# Patient Record
Sex: Male | Born: 1945
Health system: Southern US, Community
[De-identification: ages and names within clinical notes are randomized; demographics above are authoritative.]

## PROBLEM LIST (undated history)

## (undated) DIAGNOSIS — G709 Myoneural disorder, unspecified: Secondary | ICD-10-CM

## (undated) DIAGNOSIS — J189 Pneumonia, unspecified organism: Secondary | ICD-10-CM

## (undated) DIAGNOSIS — M1990 Unspecified osteoarthritis, unspecified site: Secondary | ICD-10-CM

## (undated) DIAGNOSIS — I10 Essential (primary) hypertension: Secondary | ICD-10-CM

## (undated) DIAGNOSIS — C61 Malignant neoplasm of prostate: Secondary | ICD-10-CM

## (undated) DIAGNOSIS — R011 Cardiac murmur, unspecified: Secondary | ICD-10-CM

## (undated) HISTORY — PX: EYE SURGERY: SHX253

## (undated) HISTORY — PX: PROSTATE BIOPSY: SHX241

---

## 2005-04-21 ENCOUNTER — Ambulatory Visit (HOSPITAL_COMMUNITY): Admission: RE | Admit: 2005-04-21 | Discharge: 2005-04-21 | Payer: Self-pay | Admitting: Gastroenterology

## 2015-12-17 DIAGNOSIS — J3089 Other allergic rhinitis: Secondary | ICD-10-CM | POA: Diagnosis not present

## 2015-12-17 DIAGNOSIS — J301 Allergic rhinitis due to pollen: Secondary | ICD-10-CM | POA: Diagnosis not present

## 2016-01-13 DIAGNOSIS — J301 Allergic rhinitis due to pollen: Secondary | ICD-10-CM | POA: Diagnosis not present

## 2016-01-13 DIAGNOSIS — J3089 Other allergic rhinitis: Secondary | ICD-10-CM | POA: Diagnosis not present

## 2016-02-11 DIAGNOSIS — J3089 Other allergic rhinitis: Secondary | ICD-10-CM | POA: Diagnosis not present

## 2016-02-11 DIAGNOSIS — J301 Allergic rhinitis due to pollen: Secondary | ICD-10-CM | POA: Diagnosis not present

## 2016-03-08 DIAGNOSIS — J3089 Other allergic rhinitis: Secondary | ICD-10-CM | POA: Diagnosis not present

## 2016-03-08 DIAGNOSIS — J301 Allergic rhinitis due to pollen: Secondary | ICD-10-CM | POA: Diagnosis not present

## 2016-04-12 DIAGNOSIS — J301 Allergic rhinitis due to pollen: Secondary | ICD-10-CM | POA: Diagnosis not present

## 2016-04-12 DIAGNOSIS — J3089 Other allergic rhinitis: Secondary | ICD-10-CM | POA: Diagnosis not present

## 2016-05-16 DIAGNOSIS — J3089 Other allergic rhinitis: Secondary | ICD-10-CM | POA: Diagnosis not present

## 2016-05-16 DIAGNOSIS — J301 Allergic rhinitis due to pollen: Secondary | ICD-10-CM | POA: Diagnosis not present

## 2016-06-15 DIAGNOSIS — J301 Allergic rhinitis due to pollen: Secondary | ICD-10-CM | POA: Diagnosis not present

## 2016-06-15 DIAGNOSIS — J3089 Other allergic rhinitis: Secondary | ICD-10-CM | POA: Diagnosis not present

## 2016-07-13 DIAGNOSIS — J301 Allergic rhinitis due to pollen: Secondary | ICD-10-CM | POA: Diagnosis not present

## 2016-07-13 DIAGNOSIS — J3089 Other allergic rhinitis: Secondary | ICD-10-CM | POA: Diagnosis not present

## 2016-07-18 DIAGNOSIS — J3089 Other allergic rhinitis: Secondary | ICD-10-CM | POA: Diagnosis not present

## 2016-07-18 DIAGNOSIS — J301 Allergic rhinitis due to pollen: Secondary | ICD-10-CM | POA: Diagnosis not present

## 2016-08-12 DIAGNOSIS — J301 Allergic rhinitis due to pollen: Secondary | ICD-10-CM | POA: Diagnosis not present

## 2016-08-12 DIAGNOSIS — J3089 Other allergic rhinitis: Secondary | ICD-10-CM | POA: Diagnosis not present

## 2016-09-12 DIAGNOSIS — J3089 Other allergic rhinitis: Secondary | ICD-10-CM | POA: Diagnosis not present

## 2016-09-12 DIAGNOSIS — J301 Allergic rhinitis due to pollen: Secondary | ICD-10-CM | POA: Diagnosis not present

## 2016-09-21 DIAGNOSIS — J3089 Other allergic rhinitis: Secondary | ICD-10-CM | POA: Diagnosis not present

## 2016-09-21 DIAGNOSIS — J301 Allergic rhinitis due to pollen: Secondary | ICD-10-CM | POA: Diagnosis not present

## 2016-10-04 DIAGNOSIS — J301 Allergic rhinitis due to pollen: Secondary | ICD-10-CM | POA: Diagnosis not present

## 2016-10-04 DIAGNOSIS — J3089 Other allergic rhinitis: Secondary | ICD-10-CM | POA: Diagnosis not present

## 2016-10-06 DIAGNOSIS — J301 Allergic rhinitis due to pollen: Secondary | ICD-10-CM | POA: Diagnosis not present

## 2016-10-06 DIAGNOSIS — J3089 Other allergic rhinitis: Secondary | ICD-10-CM | POA: Diagnosis not present

## 2016-10-11 DIAGNOSIS — J301 Allergic rhinitis due to pollen: Secondary | ICD-10-CM | POA: Diagnosis not present

## 2016-10-11 DIAGNOSIS — J3089 Other allergic rhinitis: Secondary | ICD-10-CM | POA: Diagnosis not present

## 2016-10-13 DIAGNOSIS — J301 Allergic rhinitis due to pollen: Secondary | ICD-10-CM | POA: Diagnosis not present

## 2016-10-13 DIAGNOSIS — J3089 Other allergic rhinitis: Secondary | ICD-10-CM | POA: Diagnosis not present

## 2016-10-17 DIAGNOSIS — J301 Allergic rhinitis due to pollen: Secondary | ICD-10-CM | POA: Diagnosis not present

## 2016-10-17 DIAGNOSIS — J3089 Other allergic rhinitis: Secondary | ICD-10-CM | POA: Diagnosis not present

## 2016-10-19 DIAGNOSIS — J3089 Other allergic rhinitis: Secondary | ICD-10-CM | POA: Diagnosis not present

## 2016-10-19 DIAGNOSIS — J301 Allergic rhinitis due to pollen: Secondary | ICD-10-CM | POA: Diagnosis not present

## 2016-10-21 DIAGNOSIS — J301 Allergic rhinitis due to pollen: Secondary | ICD-10-CM | POA: Diagnosis not present

## 2016-10-21 DIAGNOSIS — J3089 Other allergic rhinitis: Secondary | ICD-10-CM | POA: Diagnosis not present

## 2016-10-24 DIAGNOSIS — J301 Allergic rhinitis due to pollen: Secondary | ICD-10-CM | POA: Diagnosis not present

## 2016-10-24 DIAGNOSIS — J3089 Other allergic rhinitis: Secondary | ICD-10-CM | POA: Diagnosis not present

## 2016-10-26 DIAGNOSIS — J301 Allergic rhinitis due to pollen: Secondary | ICD-10-CM | POA: Diagnosis not present

## 2016-10-26 DIAGNOSIS — J3089 Other allergic rhinitis: Secondary | ICD-10-CM | POA: Diagnosis not present

## 2016-10-31 DIAGNOSIS — J3089 Other allergic rhinitis: Secondary | ICD-10-CM | POA: Diagnosis not present

## 2016-10-31 DIAGNOSIS — J301 Allergic rhinitis due to pollen: Secondary | ICD-10-CM | POA: Diagnosis not present

## 2016-11-02 DIAGNOSIS — J3089 Other allergic rhinitis: Secondary | ICD-10-CM | POA: Diagnosis not present

## 2016-11-02 DIAGNOSIS — J301 Allergic rhinitis due to pollen: Secondary | ICD-10-CM | POA: Diagnosis not present

## 2016-11-04 DIAGNOSIS — J301 Allergic rhinitis due to pollen: Secondary | ICD-10-CM | POA: Diagnosis not present

## 2016-11-04 DIAGNOSIS — J3089 Other allergic rhinitis: Secondary | ICD-10-CM | POA: Diagnosis not present

## 2016-11-07 DIAGNOSIS — J301 Allergic rhinitis due to pollen: Secondary | ICD-10-CM | POA: Diagnosis not present

## 2016-11-07 DIAGNOSIS — J3089 Other allergic rhinitis: Secondary | ICD-10-CM | POA: Diagnosis not present

## 2016-11-09 DIAGNOSIS — J301 Allergic rhinitis due to pollen: Secondary | ICD-10-CM | POA: Diagnosis not present

## 2016-11-09 DIAGNOSIS — J3089 Other allergic rhinitis: Secondary | ICD-10-CM | POA: Diagnosis not present

## 2016-11-11 DIAGNOSIS — J301 Allergic rhinitis due to pollen: Secondary | ICD-10-CM | POA: Diagnosis not present

## 2016-11-11 DIAGNOSIS — J3089 Other allergic rhinitis: Secondary | ICD-10-CM | POA: Diagnosis not present

## 2016-11-14 DIAGNOSIS — J3089 Other allergic rhinitis: Secondary | ICD-10-CM | POA: Diagnosis not present

## 2016-11-14 DIAGNOSIS — J301 Allergic rhinitis due to pollen: Secondary | ICD-10-CM | POA: Diagnosis not present

## 2016-11-16 DIAGNOSIS — J301 Allergic rhinitis due to pollen: Secondary | ICD-10-CM | POA: Diagnosis not present

## 2016-11-16 DIAGNOSIS — J3089 Other allergic rhinitis: Secondary | ICD-10-CM | POA: Diagnosis not present

## 2016-11-21 DIAGNOSIS — J3089 Other allergic rhinitis: Secondary | ICD-10-CM | POA: Diagnosis not present

## 2016-11-21 DIAGNOSIS — J301 Allergic rhinitis due to pollen: Secondary | ICD-10-CM | POA: Diagnosis not present

## 2016-11-23 DIAGNOSIS — J301 Allergic rhinitis due to pollen: Secondary | ICD-10-CM | POA: Diagnosis not present

## 2016-11-23 DIAGNOSIS — J3089 Other allergic rhinitis: Secondary | ICD-10-CM | POA: Diagnosis not present

## 2016-11-29 DIAGNOSIS — J3089 Other allergic rhinitis: Secondary | ICD-10-CM | POA: Diagnosis not present

## 2016-11-29 DIAGNOSIS — J301 Allergic rhinitis due to pollen: Secondary | ICD-10-CM | POA: Diagnosis not present

## 2016-12-01 DIAGNOSIS — J3089 Other allergic rhinitis: Secondary | ICD-10-CM | POA: Diagnosis not present

## 2016-12-01 DIAGNOSIS — J301 Allergic rhinitis due to pollen: Secondary | ICD-10-CM | POA: Diagnosis not present

## 2016-12-06 DIAGNOSIS — J3089 Other allergic rhinitis: Secondary | ICD-10-CM | POA: Diagnosis not present

## 2016-12-06 DIAGNOSIS — J301 Allergic rhinitis due to pollen: Secondary | ICD-10-CM | POA: Diagnosis not present

## 2017-01-09 DIAGNOSIS — J3089 Other allergic rhinitis: Secondary | ICD-10-CM | POA: Diagnosis not present

## 2017-01-09 DIAGNOSIS — J301 Allergic rhinitis due to pollen: Secondary | ICD-10-CM | POA: Diagnosis not present

## 2017-02-06 DIAGNOSIS — J3089 Other allergic rhinitis: Secondary | ICD-10-CM | POA: Diagnosis not present

## 2017-02-06 DIAGNOSIS — J301 Allergic rhinitis due to pollen: Secondary | ICD-10-CM | POA: Diagnosis not present

## 2017-03-15 DIAGNOSIS — J301 Allergic rhinitis due to pollen: Secondary | ICD-10-CM | POA: Diagnosis not present

## 2017-03-15 DIAGNOSIS — J3089 Other allergic rhinitis: Secondary | ICD-10-CM | POA: Diagnosis not present

## 2017-03-21 DIAGNOSIS — J3089 Other allergic rhinitis: Secondary | ICD-10-CM | POA: Diagnosis not present

## 2017-03-21 DIAGNOSIS — J301 Allergic rhinitis due to pollen: Secondary | ICD-10-CM | POA: Diagnosis not present

## 2017-04-17 DIAGNOSIS — J301 Allergic rhinitis due to pollen: Secondary | ICD-10-CM | POA: Diagnosis not present

## 2017-04-17 DIAGNOSIS — J3089 Other allergic rhinitis: Secondary | ICD-10-CM | POA: Diagnosis not present

## 2017-05-10 DIAGNOSIS — J3089 Other allergic rhinitis: Secondary | ICD-10-CM | POA: Diagnosis not present

## 2017-05-10 DIAGNOSIS — J301 Allergic rhinitis due to pollen: Secondary | ICD-10-CM | POA: Diagnosis not present

## 2017-06-08 DIAGNOSIS — J3089 Other allergic rhinitis: Secondary | ICD-10-CM | POA: Diagnosis not present

## 2017-06-08 DIAGNOSIS — J301 Allergic rhinitis due to pollen: Secondary | ICD-10-CM | POA: Diagnosis not present

## 2017-07-10 DIAGNOSIS — J301 Allergic rhinitis due to pollen: Secondary | ICD-10-CM | POA: Diagnosis not present

## 2017-07-10 DIAGNOSIS — J3089 Other allergic rhinitis: Secondary | ICD-10-CM | POA: Diagnosis not present

## 2017-07-12 DIAGNOSIS — J301 Allergic rhinitis due to pollen: Secondary | ICD-10-CM | POA: Diagnosis not present

## 2017-07-12 DIAGNOSIS — J3089 Other allergic rhinitis: Secondary | ICD-10-CM | POA: Diagnosis not present

## 2017-07-17 DIAGNOSIS — J301 Allergic rhinitis due to pollen: Secondary | ICD-10-CM | POA: Diagnosis not present

## 2017-07-17 DIAGNOSIS — J3089 Other allergic rhinitis: Secondary | ICD-10-CM | POA: Diagnosis not present

## 2017-07-19 DIAGNOSIS — J3089 Other allergic rhinitis: Secondary | ICD-10-CM | POA: Diagnosis not present

## 2017-07-19 DIAGNOSIS — J301 Allergic rhinitis due to pollen: Secondary | ICD-10-CM | POA: Diagnosis not present

## 2017-07-24 DIAGNOSIS — J3089 Other allergic rhinitis: Secondary | ICD-10-CM | POA: Diagnosis not present

## 2017-07-24 DIAGNOSIS — J301 Allergic rhinitis due to pollen: Secondary | ICD-10-CM | POA: Diagnosis not present

## 2017-08-15 DIAGNOSIS — J301 Allergic rhinitis due to pollen: Secondary | ICD-10-CM | POA: Diagnosis not present

## 2017-08-15 DIAGNOSIS — J3089 Other allergic rhinitis: Secondary | ICD-10-CM | POA: Diagnosis not present

## 2017-09-21 DIAGNOSIS — J301 Allergic rhinitis due to pollen: Secondary | ICD-10-CM | POA: Diagnosis not present

## 2017-09-21 DIAGNOSIS — Z23 Encounter for immunization: Secondary | ICD-10-CM | POA: Diagnosis not present

## 2017-09-21 DIAGNOSIS — J3089 Other allergic rhinitis: Secondary | ICD-10-CM | POA: Diagnosis not present

## 2017-10-13 DIAGNOSIS — J301 Allergic rhinitis due to pollen: Secondary | ICD-10-CM | POA: Diagnosis not present

## 2017-10-13 DIAGNOSIS — J3089 Other allergic rhinitis: Secondary | ICD-10-CM | POA: Diagnosis not present

## 2017-11-08 DIAGNOSIS — J3089 Other allergic rhinitis: Secondary | ICD-10-CM | POA: Diagnosis not present

## 2017-11-08 DIAGNOSIS — J301 Allergic rhinitis due to pollen: Secondary | ICD-10-CM | POA: Diagnosis not present

## 2017-12-11 DIAGNOSIS — J301 Allergic rhinitis due to pollen: Secondary | ICD-10-CM | POA: Diagnosis not present

## 2017-12-11 DIAGNOSIS — J3089 Other allergic rhinitis: Secondary | ICD-10-CM | POA: Diagnosis not present

## 2018-01-03 ENCOUNTER — Other Ambulatory Visit: Payer: Self-pay | Admitting: Family Medicine

## 2018-01-03 ENCOUNTER — Ambulatory Visit
Admission: RE | Admit: 2018-01-03 | Discharge: 2018-01-03 | Disposition: A | Discharge status: Home / Self Care | Payer: Medicare Other | Source: Ambulatory Visit | Attending: Family Medicine | Admitting: Family Medicine

## 2018-01-03 DIAGNOSIS — Z6823 Body mass index (BMI) 23.0-23.9, adult: Secondary | ICD-10-CM | POA: Diagnosis not present

## 2018-01-03 DIAGNOSIS — M130 Polyarthritis, unspecified: Secondary | ICD-10-CM | POA: Diagnosis not present

## 2018-01-03 DIAGNOSIS — Z125 Encounter for screening for malignant neoplasm of prostate: Secondary | ICD-10-CM | POA: Diagnosis not present

## 2018-01-03 DIAGNOSIS — I10 Essential (primary) hypertension: Secondary | ICD-10-CM | POA: Diagnosis not present

## 2018-01-03 DIAGNOSIS — M19041 Primary osteoarthritis, right hand: Secondary | ICD-10-CM | POA: Diagnosis not present

## 2018-01-08 DIAGNOSIS — J301 Allergic rhinitis due to pollen: Secondary | ICD-10-CM | POA: Diagnosis not present

## 2018-01-08 DIAGNOSIS — J3089 Other allergic rhinitis: Secondary | ICD-10-CM | POA: Diagnosis not present

## 2018-01-23 DIAGNOSIS — Z0131 Encounter for examination of blood pressure with abnormal findings: Secondary | ICD-10-CM | POA: Diagnosis not present

## 2018-01-23 DIAGNOSIS — M130 Polyarthritis, unspecified: Secondary | ICD-10-CM | POA: Diagnosis not present

## 2018-02-05 DIAGNOSIS — J3089 Other allergic rhinitis: Secondary | ICD-10-CM | POA: Diagnosis not present

## 2018-02-05 DIAGNOSIS — J301 Allergic rhinitis due to pollen: Secondary | ICD-10-CM | POA: Diagnosis not present

## 2018-03-08 DIAGNOSIS — J3089 Other allergic rhinitis: Secondary | ICD-10-CM | POA: Diagnosis not present

## 2018-03-08 DIAGNOSIS — J301 Allergic rhinitis due to pollen: Secondary | ICD-10-CM | POA: Diagnosis not present

## 2018-04-06 DIAGNOSIS — J3089 Other allergic rhinitis: Secondary | ICD-10-CM | POA: Diagnosis not present

## 2018-04-06 DIAGNOSIS — J3081 Allergic rhinitis due to animal (cat) (dog) hair and dander: Secondary | ICD-10-CM | POA: Diagnosis not present

## 2018-04-06 DIAGNOSIS — J301 Allergic rhinitis due to pollen: Secondary | ICD-10-CM | POA: Diagnosis not present

## 2018-04-18 DIAGNOSIS — M130 Polyarthritis, unspecified: Secondary | ICD-10-CM | POA: Diagnosis not present

## 2018-04-18 DIAGNOSIS — E782 Mixed hyperlipidemia: Secondary | ICD-10-CM | POA: Diagnosis not present

## 2018-05-07 DIAGNOSIS — J301 Allergic rhinitis due to pollen: Secondary | ICD-10-CM | POA: Diagnosis not present

## 2018-05-07 DIAGNOSIS — J3089 Other allergic rhinitis: Secondary | ICD-10-CM | POA: Diagnosis not present

## 2018-05-21 DIAGNOSIS — J3089 Other allergic rhinitis: Secondary | ICD-10-CM | POA: Diagnosis not present

## 2018-05-21 DIAGNOSIS — J301 Allergic rhinitis due to pollen: Secondary | ICD-10-CM | POA: Diagnosis not present

## 2018-05-22 ENCOUNTER — Emergency Department (HOSPITAL_BASED_OUTPATIENT_CLINIC_OR_DEPARTMENT_OTHER): Payer: Medicare Other

## 2018-05-22 ENCOUNTER — Encounter (HOSPITAL_BASED_OUTPATIENT_CLINIC_OR_DEPARTMENT_OTHER): Payer: Self-pay | Admitting: Adult Health

## 2018-05-22 ENCOUNTER — Other Ambulatory Visit: Payer: Self-pay

## 2018-05-22 ENCOUNTER — Emergency Department (HOSPITAL_BASED_OUTPATIENT_CLINIC_OR_DEPARTMENT_OTHER)
Admission: EM | Admit: 2018-05-22 | Discharge: 2018-05-22 | Disposition: A | Discharge status: Home / Self Care | Payer: Medicare Other | Attending: Emergency Medicine | Admitting: Emergency Medicine

## 2018-05-22 DIAGNOSIS — Y998 Other external cause status: Secondary | ICD-10-CM | POA: Insufficient documentation

## 2018-05-22 DIAGNOSIS — Y92009 Unspecified place in unspecified non-institutional (private) residence as the place of occurrence of the external cause: Secondary | ICD-10-CM

## 2018-05-22 DIAGNOSIS — W108XXA Fall (on) (from) other stairs and steps, initial encounter: Secondary | ICD-10-CM | POA: Insufficient documentation

## 2018-05-22 DIAGNOSIS — S4352XA Sprain of left acromioclavicular joint, initial encounter: Secondary | ICD-10-CM | POA: Diagnosis not present

## 2018-05-22 DIAGNOSIS — Y92018 Other place in single-family (private) house as the place of occurrence of the external cause: Secondary | ICD-10-CM | POA: Diagnosis not present

## 2018-05-22 DIAGNOSIS — S20212A Contusion of left front wall of thorax, initial encounter: Secondary | ICD-10-CM | POA: Diagnosis not present

## 2018-05-22 DIAGNOSIS — S4992XA Unspecified injury of left shoulder and upper arm, initial encounter: Secondary | ICD-10-CM | POA: Diagnosis not present

## 2018-05-22 DIAGNOSIS — S43102A Unspecified dislocation of left acromioclavicular joint, initial encounter: Secondary | ICD-10-CM | POA: Diagnosis not present

## 2018-05-22 DIAGNOSIS — M25512 Pain in left shoulder: Secondary | ICD-10-CM | POA: Diagnosis not present

## 2018-05-22 DIAGNOSIS — Y9389 Activity, other specified: Secondary | ICD-10-CM | POA: Insufficient documentation

## 2018-05-22 DIAGNOSIS — F17200 Nicotine dependence, unspecified, uncomplicated: Secondary | ICD-10-CM | POA: Diagnosis not present

## 2018-05-22 DIAGNOSIS — S299XXA Unspecified injury of thorax, initial encounter: Secondary | ICD-10-CM | POA: Diagnosis not present

## 2018-05-22 DIAGNOSIS — W19XXXA Unspecified fall, initial encounter: Secondary | ICD-10-CM

## 2018-05-22 MED ORDER — TRAMADOL HCL 50 MG PO TABS: 50.0000 mg | ORAL_TABLET | Freq: Four times a day (QID) | ORAL | 0 refills | Status: DC | PRN

## 2018-05-22 MED ORDER — IBUPROFEN 800 MG PO TABS: 800.0000 mg | ORAL_TABLET | Freq: Three times a day (TID) | ORAL | 0 refills | Status: DC | PRN

## 2018-05-22 NOTE — ED Provider Notes (Signed)
[facility] EMERGENCY DEPARTMENT Provider Note   CSN: [id number] Arrival date & time: 05/22/18  1722     History   Chief Complaint Chief Complaint  Patient presents with  . Fall    HPI Clayton Bosserman is a 72 y.o. male.  HPI Patient presents to the emergency department with left shoulder and left rib pain following a fall that occurred this past Sunday.  The patient states he is visiting his daughter when you too much to drink states that he was not used to the house and when he went to the bathroom he missed a step and fell down several more steps.  Patient states that he had no other injury.  The patient states that movement and palpation make the shoulder pain worse and movement in certain position along with deep breathing and coughing make the pain in his ribs worse.  Patient states that nothing seems to make the condition better.  He states that he did not take any medications prior to arrival.  Patient denies shortness of breath, nausea, vomiting, abdominal pain, headache, blurred vision, nausea, diarrhea, back pain, neck pain or syncope History reviewed. No pertinent past medical history.  There are no active problems to display for this patient.   History reviewed. No pertinent surgical history.      Home Medications    Prior to Admission medications   Not on File    Family History History reviewed. No pertinent family history.  Social History Social History   Tobacco Use  . Smoking status: Current Every Day Smoker  . Smokeless tobacco: Never Used  Substance Use Topics  . Alcohol use: Yes  . Drug use: Never     Allergies   Patient has no known allergies.   Review of Systems Review of Systems All other systems negative except as documented in the HPI. All pertinent positives and negatives as reviewed in the HPI. Physical Exam Updated Vital Signs BP 140/74   Pulse 77   Temp 98.1 F (36.7 C) (Oral)   Resp 18   Ht 5\' 11"  (1.803 m)   Wt  70.3 kg (155 lb)   SpO2 99%   BMI 21.62 kg/m   Physical Exam  Constitutional: He is oriented to person, place, and time. He appears well-developed and well-nourished. No distress.  HENT:  Head: Normocephalic and atraumatic.  Mouth/Throat: Oropharynx is clear and moist.  Eyes: Pupils are equal, round, and reactive to light.  Neck: Normal range of motion. Neck supple.  Cardiovascular: Normal rate, regular rhythm and normal heart sounds. Exam reveals no gallop and no friction rub.  No murmur heard. Pulmonary/Chest: Effort normal and breath sounds normal. No respiratory distress. He has no wheezes. He exhibits tenderness.    Abdominal: There is no tenderness. There is no guarding.  Musculoskeletal:       Left shoulder: He exhibits decreased range of motion, tenderness and pain. He exhibits no swelling, no deformity, no spasm, normal pulse and normal strength.       Arms: Neurological: He is alert and oriented to person, place, and time. He exhibits normal muscle tone. Coordination normal.  Skin: Skin is warm and dry. Capillary refill takes less than 2 seconds. No rash noted. No erythema.  Psychiatric: He has a normal mood and affect. His behavior is normal.  Nursing note and vitals reviewed.    ED Treatments / Results  Labs (all labs ordered are listed, but only abnormal results are displayed) Labs Reviewed - No data  to display  EKG None  Radiology Dg Ribs Unilateral W/chest Left  Result Date: 05/22/2018 CLINICAL DATA:  Patient fell down 6 steps 2 days ago and presents with left shoulder pain. EXAM: LEFT RIBS AND CHEST - 3+ VIEW COMPARISON:  None. FINDINGS: Heart size is normal. There is mild aortic atherosclerosis without aneurysmal dilatation. No mediastinal widening is visualized. No acute pulmonary consolidation, effusion or pneumothorax is noted. The included ribs are intact without acute displaced fracture. Slight offset of the [facility] joint compatible with an AC joint sprain.  IMPRESSION: No acute cardiopulmonary abnormality. No acute displaced left rib fracture. Slight incongruity of the left AC joint compatible with a mild AC joint sprain. Electronically Signed   By: Tollie Ethavid  Kwon M.D.   On: 05/22/2018 18:33   Dg Shoulder Left  Result Date: 05/22/2018 CLINICAL DATA:  Left shoulder pain after fall 2 days ago. EXAM: LEFT SHOULDER - 2+ VIEW COMPARISON:  None. FINDINGS: Incongruity of the [facility] joint compatible with mild grade 2 AC joint sprain. No widening of the coracoclavicular distance is apparent to suggest a grade 3 dislocation. No fracture is identified. The adjacent ribs and lung are intact. Aortic atherosclerosis without aneurysmal dilatation is seen. IMPRESSION: Incongruity of the [facility] joint compatible with a grade 2 AC joint sprain. Electronically Signed   By: Tollie Ethavid  Kwon M.D.   On: 05/22/2018 18:35    Procedures Procedures (including critical care time)  Medications Ordered in ED Medications - No data to display   Initial Impression / Assessment and Plan / ED Course  I have reviewed the triage vital signs and the nursing notes.  Pertinent labs & imaging results that were available during my care of the patient were reviewed by me and considered in my medical decision making (see chart for details).    Patient has what appears to be an [facility] separation.  The patient will be sent put in a sling and given Ortho follow-up.  Patient is advised to return here as needed.  Patient agrees the plan and all questions were answered.  Told to use ice and heat on the shoulder.  Final Clinical Impressions(s) / ED Diagnoses   Final diagnoses:  None    ED Discharge Orders    None       Kyra MangesLawyer, Ryllie Nieland, PA-C 05/22/18 1856    Benjiman CorePickering, Nathan, MD 05/22/18 [id number]

## 2018-05-22 NOTE — Discharge Instructions (Signed)
Follow-up with the orthopedist provided.  Return here as needed.  Use ice and heat on your shoulder and ribs.  The shoulder x-ray showed that you have a separation of your [facility] joint.  The ribs did not show any fractures.

## 2018-05-22 NOTE — ED Triage Notes (Addendum)
Pt fell Sunday while visiting his daughter in [place], he fell down 6 steps. HE is complaining of left shoulder pain and pain with deep inspiration or cough.  He is having difficulty abducting left arm from body.

## 2018-06-06 DIAGNOSIS — J301 Allergic rhinitis due to pollen: Secondary | ICD-10-CM | POA: Diagnosis not present

## 2018-06-06 DIAGNOSIS — J3089 Other allergic rhinitis: Secondary | ICD-10-CM | POA: Diagnosis not present

## 2018-06-12 DIAGNOSIS — J301 Allergic rhinitis due to pollen: Secondary | ICD-10-CM | POA: Diagnosis not present

## 2018-06-12 DIAGNOSIS — J3089 Other allergic rhinitis: Secondary | ICD-10-CM | POA: Diagnosis not present

## 2018-06-14 DIAGNOSIS — J301 Allergic rhinitis due to pollen: Secondary | ICD-10-CM | POA: Diagnosis not present

## 2018-06-14 DIAGNOSIS — J3089 Other allergic rhinitis: Secondary | ICD-10-CM | POA: Diagnosis not present

## 2018-06-14 DIAGNOSIS — Z0000 Encounter for general adult medical examination without abnormal findings: Secondary | ICD-10-CM | POA: Diagnosis not present

## 2018-06-18 DIAGNOSIS — J301 Allergic rhinitis due to pollen: Secondary | ICD-10-CM | POA: Diagnosis not present

## 2018-06-18 DIAGNOSIS — J3089 Other allergic rhinitis: Secondary | ICD-10-CM | POA: Diagnosis not present

## 2018-06-20 DIAGNOSIS — J3089 Other allergic rhinitis: Secondary | ICD-10-CM | POA: Diagnosis not present

## 2018-06-20 DIAGNOSIS — J301 Allergic rhinitis due to pollen: Secondary | ICD-10-CM | POA: Diagnosis not present

## 2018-07-09 DIAGNOSIS — J3089 Other allergic rhinitis: Secondary | ICD-10-CM | POA: Diagnosis not present

## 2018-07-09 DIAGNOSIS — J301 Allergic rhinitis due to pollen: Secondary | ICD-10-CM | POA: Diagnosis not present

## 2018-08-10 DIAGNOSIS — J301 Allergic rhinitis due to pollen: Secondary | ICD-10-CM | POA: Diagnosis not present

## 2018-08-10 DIAGNOSIS — J3089 Other allergic rhinitis: Secondary | ICD-10-CM | POA: Diagnosis not present

## 2018-09-05 DIAGNOSIS — J3089 Other allergic rhinitis: Secondary | ICD-10-CM | POA: Diagnosis not present

## 2018-09-05 DIAGNOSIS — J301 Allergic rhinitis due to pollen: Secondary | ICD-10-CM | POA: Diagnosis not present

## 2018-09-18 DIAGNOSIS — Z6822 Body mass index (BMI) 22.0-22.9, adult: Secondary | ICD-10-CM | POA: Diagnosis not present

## 2018-09-18 DIAGNOSIS — I10 Essential (primary) hypertension: Secondary | ICD-10-CM | POA: Diagnosis not present

## 2018-09-20 DIAGNOSIS — Z23 Encounter for immunization: Secondary | ICD-10-CM | POA: Diagnosis not present

## 2018-09-20 DIAGNOSIS — J3089 Other allergic rhinitis: Secondary | ICD-10-CM | POA: Diagnosis not present

## 2018-09-20 DIAGNOSIS — J301 Allergic rhinitis due to pollen: Secondary | ICD-10-CM | POA: Diagnosis not present

## 2018-10-05 DIAGNOSIS — J301 Allergic rhinitis due to pollen: Secondary | ICD-10-CM | POA: Diagnosis not present

## 2018-10-05 DIAGNOSIS — J3089 Other allergic rhinitis: Secondary | ICD-10-CM | POA: Diagnosis not present

## 2018-10-11 DIAGNOSIS — Z23 Encounter for immunization: Secondary | ICD-10-CM | POA: Diagnosis not present

## 2018-11-06 DIAGNOSIS — J3089 Other allergic rhinitis: Secondary | ICD-10-CM | POA: Diagnosis not present

## 2018-11-06 DIAGNOSIS — J301 Allergic rhinitis due to pollen: Secondary | ICD-10-CM | POA: Diagnosis not present

## 2018-11-06 DIAGNOSIS — I10 Essential (primary) hypertension: Secondary | ICD-10-CM | POA: Diagnosis not present

## 2018-12-13 DIAGNOSIS — J3089 Other allergic rhinitis: Secondary | ICD-10-CM | POA: Diagnosis not present

## 2018-12-13 DIAGNOSIS — J301 Allergic rhinitis due to pollen: Secondary | ICD-10-CM | POA: Diagnosis not present

## 2019-01-07 DIAGNOSIS — J3089 Other allergic rhinitis: Secondary | ICD-10-CM | POA: Diagnosis not present

## 2019-01-07 DIAGNOSIS — J301 Allergic rhinitis due to pollen: Secondary | ICD-10-CM | POA: Diagnosis not present

## 2019-02-04 DIAGNOSIS — J301 Allergic rhinitis due to pollen: Secondary | ICD-10-CM | POA: Diagnosis not present

## 2019-02-04 DIAGNOSIS — J3089 Other allergic rhinitis: Secondary | ICD-10-CM | POA: Diagnosis not present

## 2019-03-08 DIAGNOSIS — J3089 Other allergic rhinitis: Secondary | ICD-10-CM | POA: Diagnosis not present

## 2019-03-08 DIAGNOSIS — J301 Allergic rhinitis due to pollen: Secondary | ICD-10-CM | POA: Diagnosis not present

## 2019-04-05 DIAGNOSIS — J301 Allergic rhinitis due to pollen: Secondary | ICD-10-CM | POA: Diagnosis not present

## 2019-04-05 DIAGNOSIS — J3089 Other allergic rhinitis: Secondary | ICD-10-CM | POA: Diagnosis not present

## 2019-05-09 DIAGNOSIS — J301 Allergic rhinitis due to pollen: Secondary | ICD-10-CM | POA: Diagnosis not present

## 2019-05-09 DIAGNOSIS — Z6822 Body mass index (BMI) 22.0-22.9, adult: Secondary | ICD-10-CM | POA: Diagnosis not present

## 2019-05-09 DIAGNOSIS — Z125 Encounter for screening for malignant neoplasm of prostate: Secondary | ICD-10-CM | POA: Diagnosis not present

## 2019-05-09 DIAGNOSIS — M130 Polyarthritis, unspecified: Secondary | ICD-10-CM | POA: Diagnosis not present

## 2019-05-09 DIAGNOSIS — J3089 Other allergic rhinitis: Secondary | ICD-10-CM | POA: Diagnosis not present

## 2019-05-09 DIAGNOSIS — J90 Pleural effusion, not elsewhere classified: Secondary | ICD-10-CM | POA: Diagnosis not present

## 2019-05-09 DIAGNOSIS — E782 Mixed hyperlipidemia: Secondary | ICD-10-CM | POA: Diagnosis not present

## 2019-05-14 DIAGNOSIS — J301 Allergic rhinitis due to pollen: Secondary | ICD-10-CM | POA: Diagnosis not present

## 2019-05-14 DIAGNOSIS — J3089 Other allergic rhinitis: Secondary | ICD-10-CM | POA: Diagnosis not present

## 2019-05-17 IMAGING — DX DG SHOULDER 2+V*L*
3 series · 3 of 3 positions shown · non-contrast
Comparison: None.

CLINICAL DATA: Left shoulder pain after fall 2 days ago.

EXAM:
LEFT SHOULDER - 2+ VIEW

[shoulder grashey]
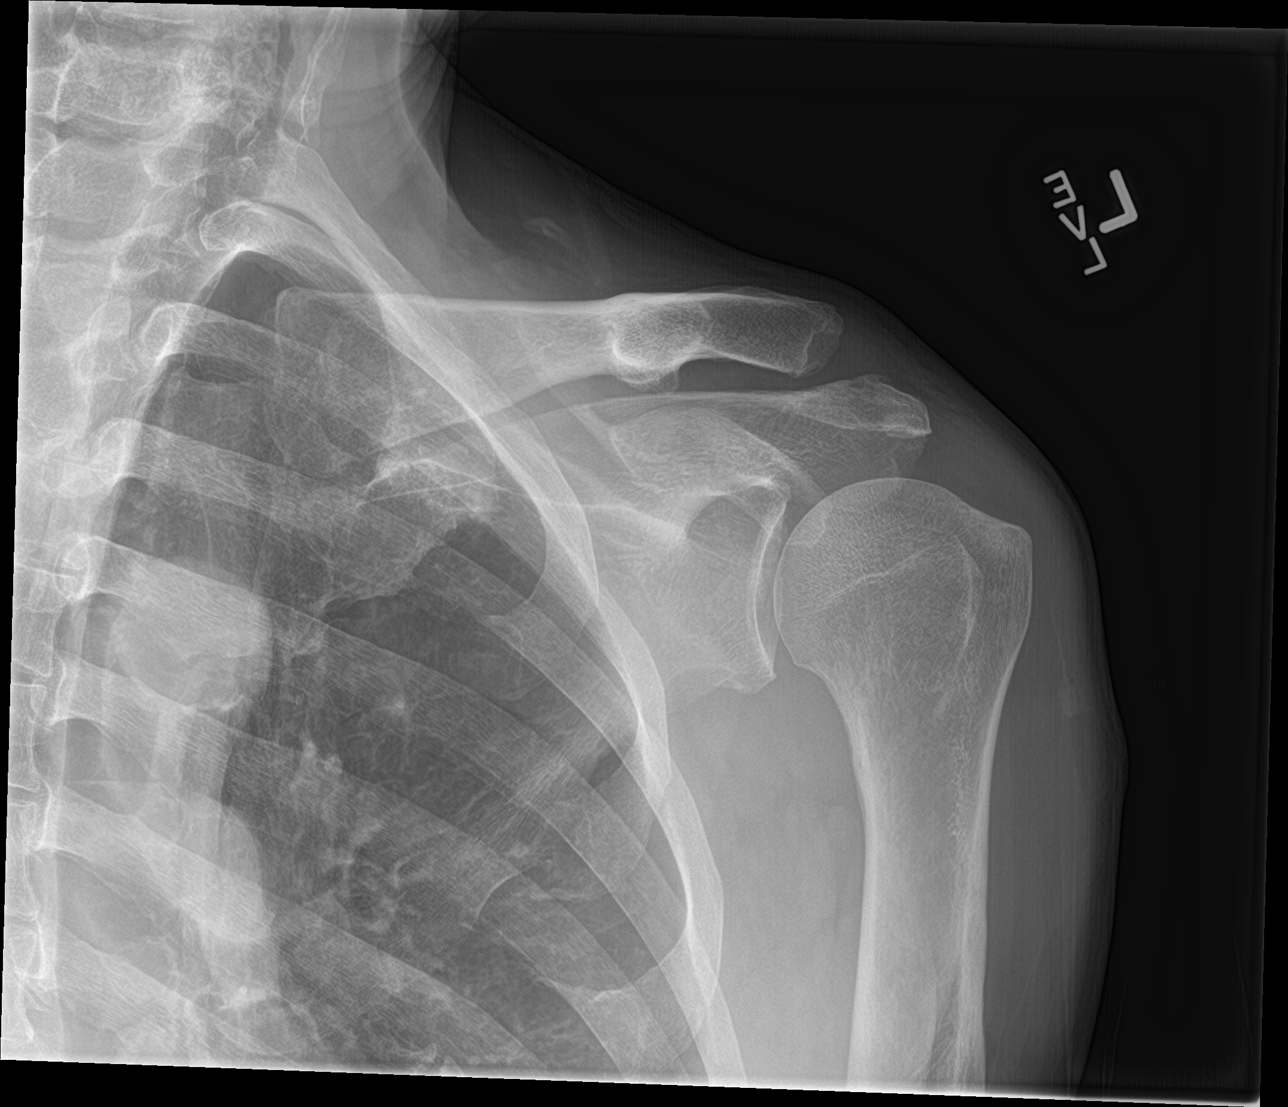

[shoulder y view]
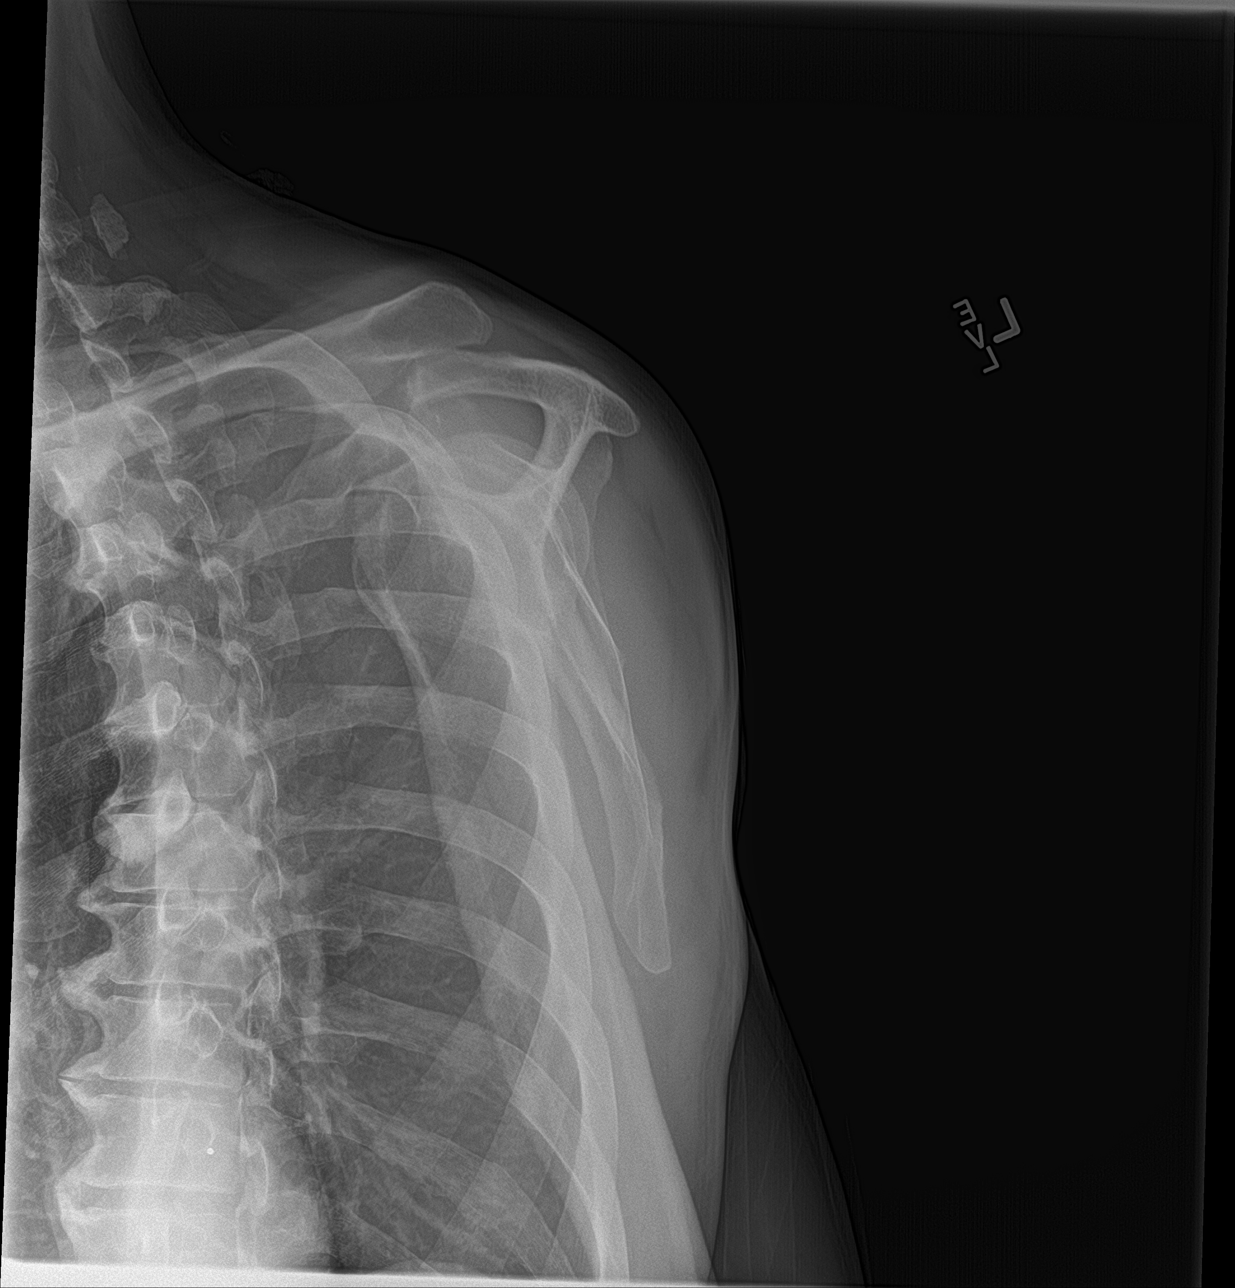

[shoulder axillary]
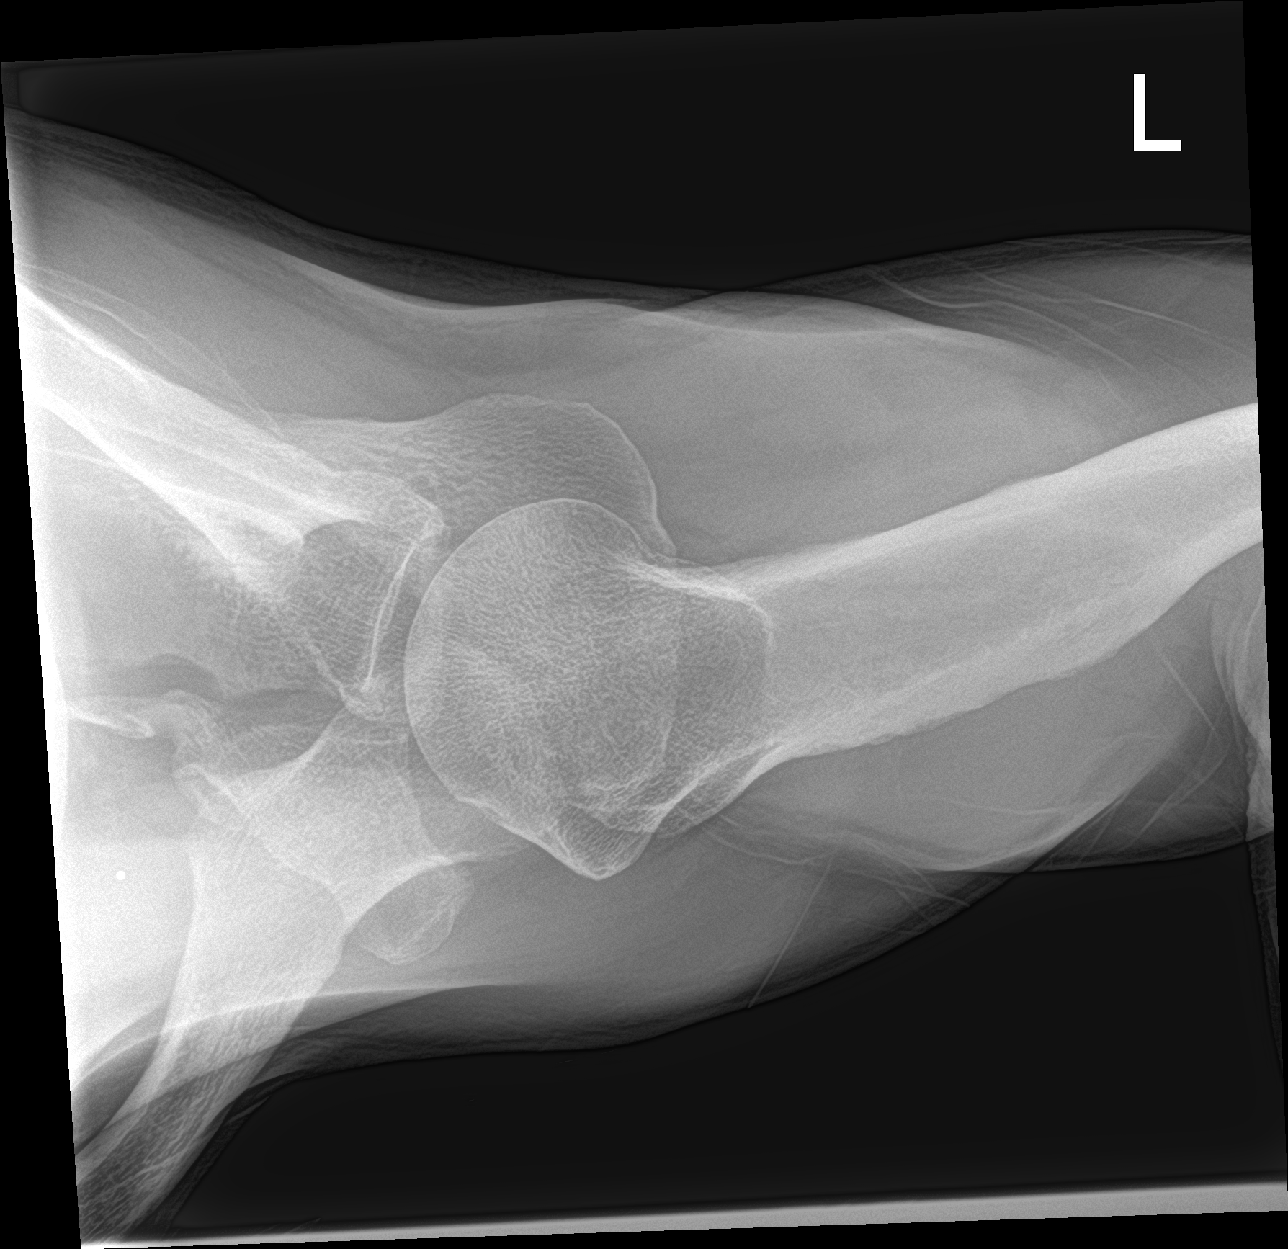

[3 of 3 positions shown; findings below may reference images not displayed]

FINDINGS: Incongruity of the AC joint compatible with mild grade 2 AC joint
sprain. No widening of the coracoclavicular distance is apparent to
suggest a grade 3 dislocation. No fracture is identified. The
adjacent ribs and lung are intact. Aortic atherosclerosis without
aneurysmal dilatation is seen.
IMPRESSION: Incongruity of the AC joint compatible with a grade 2 AC joint
sprain.

## 2019-05-17 IMAGING — DX DG RIBS W/ CHEST 3+V*L*
5 series · 5 of 5 positions shown · non-contrast
Comparison: None.

CLINICAL DATA: Patient fell down 6 steps 2 days ago and presents
with left shoulder pain.

EXAM:
LEFT RIBS AND CHEST - 3+ VIEW

[chest pa]
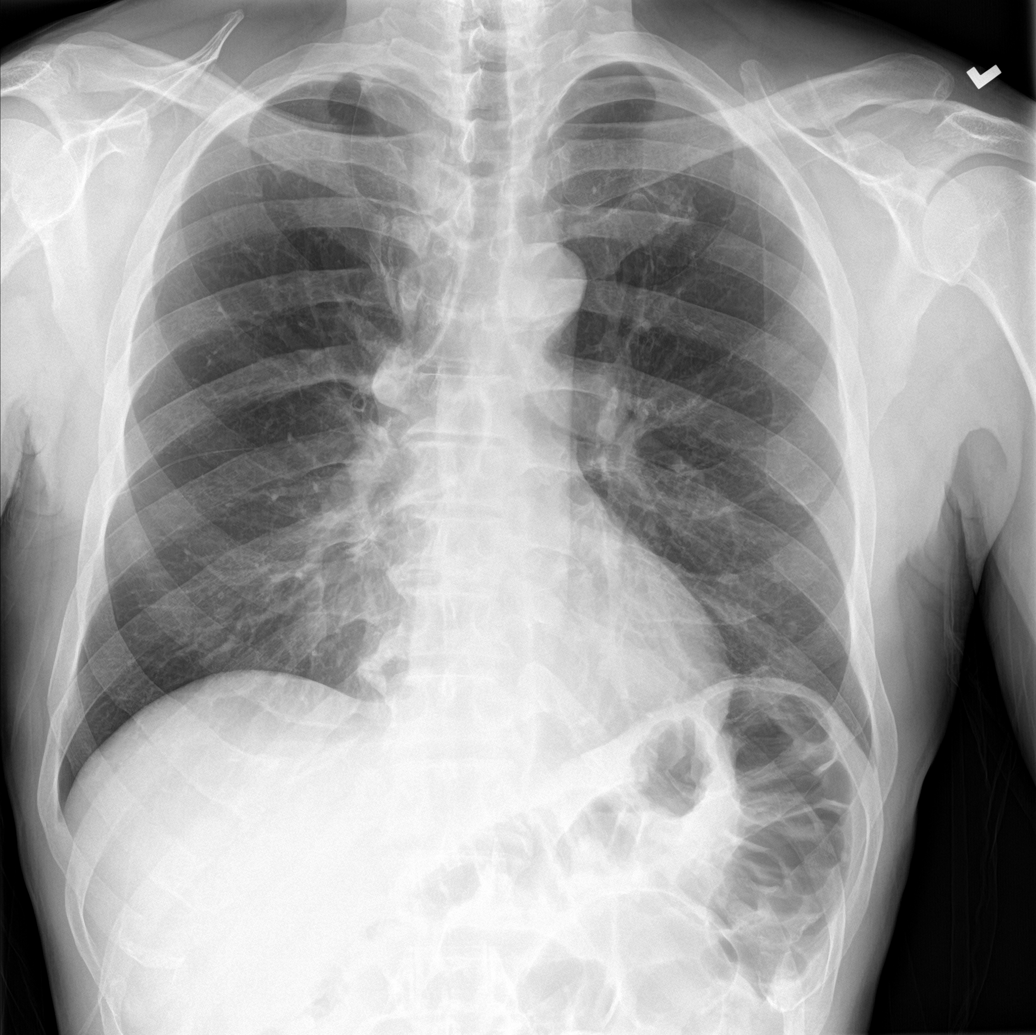

[rib pa]
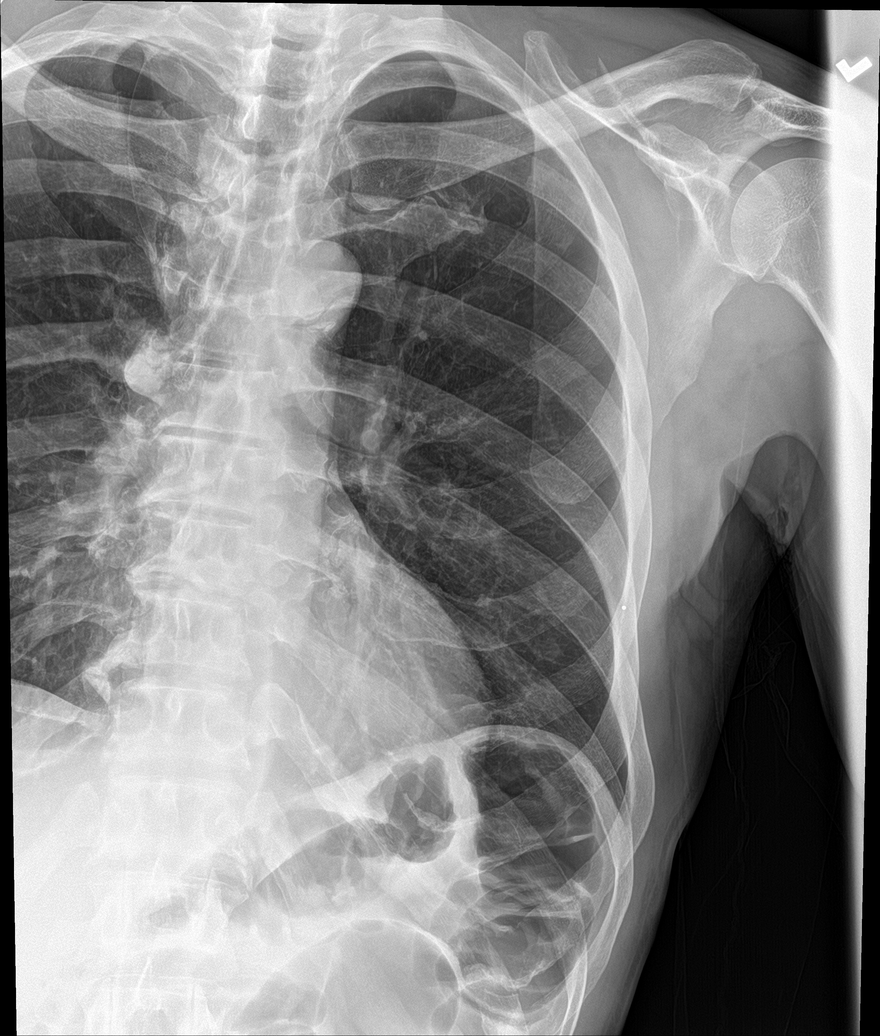

[rib pa obl]
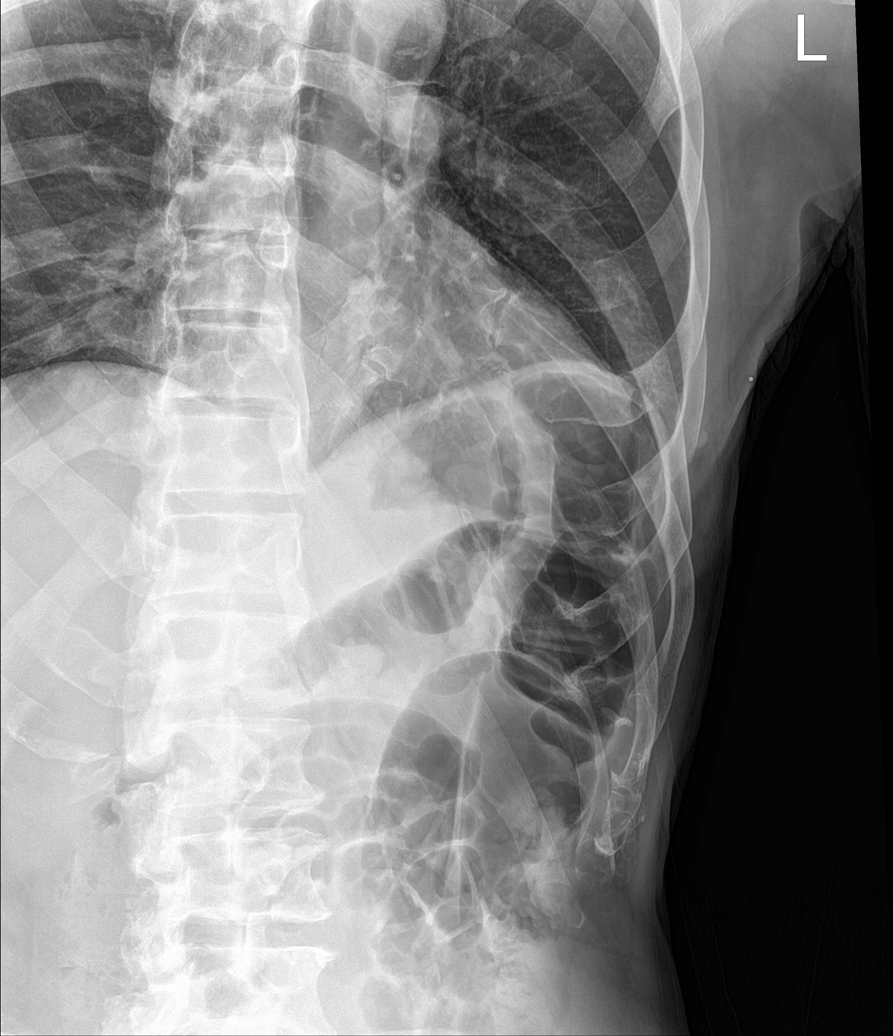

[rib ap]
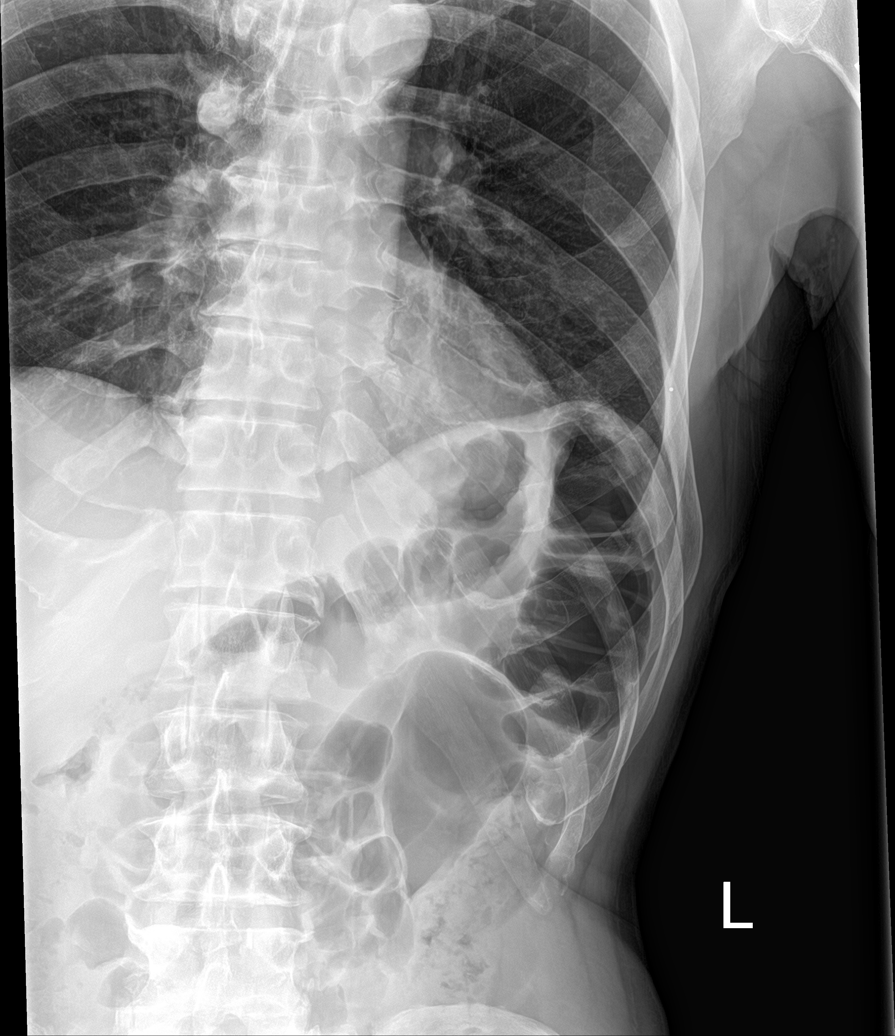

[rib ap obl]
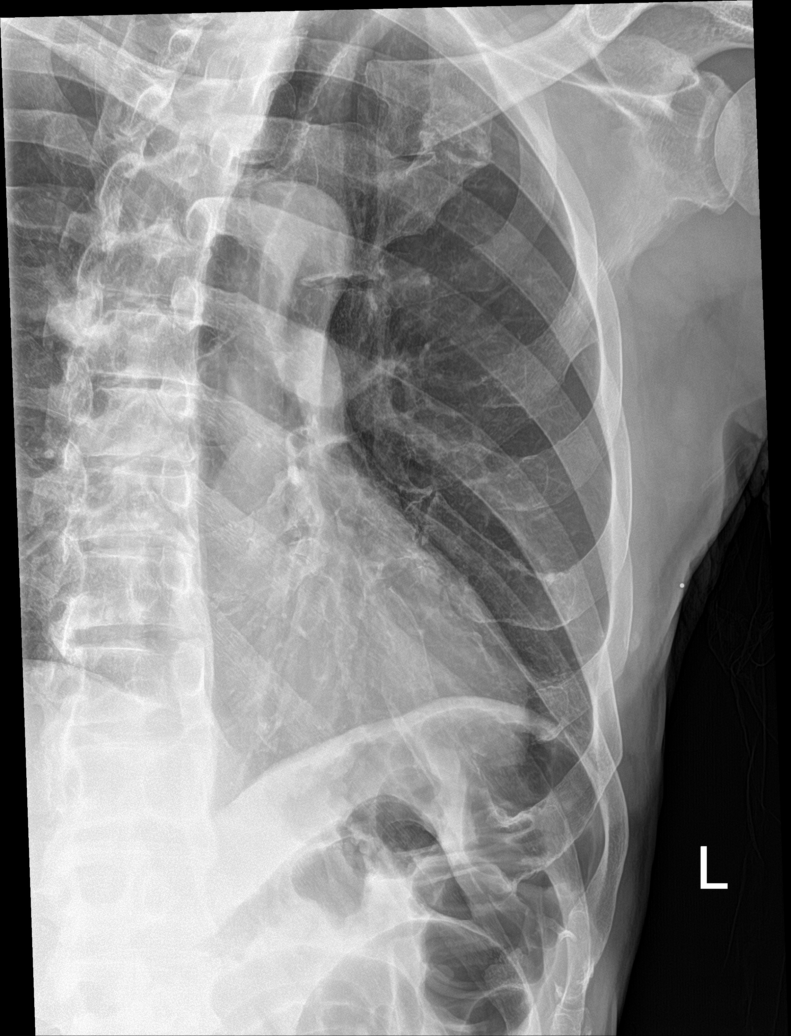

[5 of 5 positions shown; findings below may reference images not displayed]

FINDINGS: Heart size is normal. There is mild aortic atherosclerosis without
aneurysmal dilatation. No mediastinal widening is visualized. No
acute pulmonary consolidation, effusion or pneumothorax is noted.
The included ribs are intact without acute displaced fracture.
Slight offset of the AC joint compatible with an AC joint sprain.
IMPRESSION: No acute cardiopulmonary abnormality.

No acute displaced left rib fracture.

Slight incongruity of the left AC joint compatible with a mild AC
joint sprain.

## 2019-05-31 DIAGNOSIS — J3089 Other allergic rhinitis: Secondary | ICD-10-CM | POA: Diagnosis not present

## 2019-05-31 DIAGNOSIS — J301 Allergic rhinitis due to pollen: Secondary | ICD-10-CM | POA: Diagnosis not present

## 2019-06-03 DIAGNOSIS — J301 Allergic rhinitis due to pollen: Secondary | ICD-10-CM | POA: Diagnosis not present

## 2019-06-03 DIAGNOSIS — J3089 Other allergic rhinitis: Secondary | ICD-10-CM | POA: Diagnosis not present

## 2019-06-05 DIAGNOSIS — J301 Allergic rhinitis due to pollen: Secondary | ICD-10-CM | POA: Diagnosis not present

## 2019-06-05 DIAGNOSIS — J3089 Other allergic rhinitis: Secondary | ICD-10-CM | POA: Diagnosis not present

## 2019-06-10 DIAGNOSIS — J301 Allergic rhinitis due to pollen: Secondary | ICD-10-CM | POA: Diagnosis not present

## 2019-06-10 DIAGNOSIS — J3089 Other allergic rhinitis: Secondary | ICD-10-CM | POA: Diagnosis not present

## 2019-06-12 DIAGNOSIS — J301 Allergic rhinitis due to pollen: Secondary | ICD-10-CM | POA: Diagnosis not present

## 2019-06-12 DIAGNOSIS — J3089 Other allergic rhinitis: Secondary | ICD-10-CM | POA: Diagnosis not present

## 2019-07-08 DIAGNOSIS — J301 Allergic rhinitis due to pollen: Secondary | ICD-10-CM | POA: Diagnosis not present

## 2019-07-08 DIAGNOSIS — J3089 Other allergic rhinitis: Secondary | ICD-10-CM | POA: Diagnosis not present

## 2019-08-13 DIAGNOSIS — J3089 Other allergic rhinitis: Secondary | ICD-10-CM | POA: Diagnosis not present

## 2019-08-13 DIAGNOSIS — J301 Allergic rhinitis due to pollen: Secondary | ICD-10-CM | POA: Diagnosis not present

## 2019-08-26 DIAGNOSIS — H524 Presbyopia: Secondary | ICD-10-CM | POA: Diagnosis not present

## 2019-08-26 DIAGNOSIS — H26492 Other secondary cataract, left eye: Secondary | ICD-10-CM | POA: Diagnosis not present

## 2019-08-26 DIAGNOSIS — H35033 Hypertensive retinopathy, bilateral: Secondary | ICD-10-CM | POA: Diagnosis not present

## 2019-08-26 DIAGNOSIS — H5201 Hypermetropia, right eye: Secondary | ICD-10-CM | POA: Diagnosis not present

## 2019-08-26 DIAGNOSIS — H52221 Regular astigmatism, right eye: Secondary | ICD-10-CM | POA: Diagnosis not present

## 2019-08-26 DIAGNOSIS — H2511 Age-related nuclear cataract, right eye: Secondary | ICD-10-CM | POA: Diagnosis not present

## 2019-09-09 DIAGNOSIS — J3089 Other allergic rhinitis: Secondary | ICD-10-CM | POA: Diagnosis not present

## 2019-09-09 DIAGNOSIS — Z23 Encounter for immunization: Secondary | ICD-10-CM | POA: Diagnosis not present

## 2019-09-09 DIAGNOSIS — J301 Allergic rhinitis due to pollen: Secondary | ICD-10-CM | POA: Diagnosis not present

## 2019-09-24 DIAGNOSIS — J3089 Other allergic rhinitis: Secondary | ICD-10-CM | POA: Diagnosis not present

## 2019-09-24 DIAGNOSIS — J301 Allergic rhinitis due to pollen: Secondary | ICD-10-CM | POA: Diagnosis not present

## 2019-10-08 DIAGNOSIS — J3089 Other allergic rhinitis: Secondary | ICD-10-CM | POA: Diagnosis not present

## 2019-10-08 DIAGNOSIS — J301 Allergic rhinitis due to pollen: Secondary | ICD-10-CM | POA: Diagnosis not present

## 2019-11-07 DIAGNOSIS — J3089 Other allergic rhinitis: Secondary | ICD-10-CM | POA: Diagnosis not present

## 2019-11-07 DIAGNOSIS — Z6823 Body mass index (BMI) 23.0-23.9, adult: Secondary | ICD-10-CM | POA: Diagnosis not present

## 2019-11-07 DIAGNOSIS — I10 Essential (primary) hypertension: Secondary | ICD-10-CM | POA: Diagnosis not present

## 2019-11-07 DIAGNOSIS — J301 Allergic rhinitis due to pollen: Secondary | ICD-10-CM | POA: Diagnosis not present

## 2020-01-25 ENCOUNTER — Ambulatory Visit: Payer: Medicare Other | Attending: Internal Medicine

## 2020-01-25 DIAGNOSIS — Z23 Encounter for immunization: Secondary | ICD-10-CM | POA: Insufficient documentation

## 2020-01-25 NOTE — Progress Notes (Signed)
   Covid-19 Vaccination Clinic  Name:  Mac Dowdell    MRN: [id number] DOB: July 15, 1946  01/25/2020  Mr. Bozman was observed post Covid-19 immunization for 15 minutes without incidence. He was provided with Vaccine Information Sheet and instruction to access the V-Safe system.   Mr. Burkey was instructed to call 911 with any severe reactions post vaccine: Marland Kitchen Difficulty breathing  . Swelling of your face and throat  . A fast heartbeat  . A bad rash all over your body  . Dizziness and weakness    Immunizations Administered    Name Date Dose VIS Date Route   Pfizer COVID-19 Vaccine 01/25/2020  2:29 PM 0.3 mL 11/15/2019 Intramuscular   Manufacturer: [employer], [employer]   Lot: [id number]   NDC: [address]-4

## 2020-02-25 ENCOUNTER — Ambulatory Visit: Payer: Medicare Other | Attending: Internal Medicine

## 2020-02-25 DIAGNOSIS — Z23 Encounter for immunization: Secondary | ICD-10-CM

## 2020-02-25 NOTE — Progress Notes (Signed)
   Covid-19 Vaccination Clinic  Name:  Thomas Sharp    MRN: [id number] DOB: Jun 28, 1946  02/25/2020  Thomas Sharp was observed post Covid-19 immunization for 15 minutes without incident. He was provided with Vaccine Information Sheet and instruction to access the V-Safe system.   Thomas Sharp was instructed to call 911 with any severe reactions post vaccine: Marland Kitchen Difficulty breathing  . Swelling of face and throat  . A fast heartbeat  . A bad rash all over body  . Dizziness and weakness   Immunizations Administered    Name Date Dose VIS Date Route   Pfizer COVID-19 Vaccine 02/25/2020  1:49 PM 0.3 mL 11/15/2019 Intramuscular   Manufacturer: [employer], [employer]   Lot: [id number]   NDC: [address]-1

## 2020-07-28 ENCOUNTER — Ambulatory Visit: Payer: Medicare Other | Admitting: Orthopedic Surgery

## 2020-07-28 ENCOUNTER — Encounter: Payer: Self-pay | Admitting: Orthopedic Surgery

## 2020-07-28 ENCOUNTER — Ambulatory Visit: Payer: Self-pay

## 2020-07-28 VITALS — Ht 71.0 in | Wt 155.0 lb

## 2020-07-28 DIAGNOSIS — M5441 Lumbago with sciatica, right side: Secondary | ICD-10-CM | POA: Diagnosis not present

## 2020-07-28 DIAGNOSIS — G8929 Other chronic pain: Secondary | ICD-10-CM

## 2020-07-28 MED ORDER — PREDNISONE 10 MG PO TABS: 20.0000 mg | ORAL_TABLET | Freq: Every day | ORAL | 0 refills | Status: DC

## 2020-07-30 ENCOUNTER — Encounter: Payer: Self-pay | Admitting: Orthopedic Surgery

## 2020-07-30 NOTE — Progress Notes (Signed)
   Office Visit Note   Patient: Thomas Sharp           Date of Birth: 30-May-1946           MRN: [id number] Visit Date: 07/28/2020              Requested by: Renaye Rakers, MD [address] [place],  [place] [address] PCP: Renaye Rakers, MD  Chief Complaint  Patient presents with  . Right Leg - Pain      HPI: Patient is a 74 year old gentleman who presents with right-sided radicular pain down the lateral aspect of the right calf patient complains of pain with driving pain with sitting standing walking and laying down.  He states that if he raises his right arm his radicular pain gets worse.  Assessment & Plan: Visit Diagnoses:  1. Chronic right-sided low back pain with right-sided sciatica     Plan: Prescription was called in for prednisone reevaluate in 4 weeks.  Discussed that we may need to obtain an MRI scan.  Follow-Up Instructions: Return in about 4 weeks (around 08/25/2020).   Ortho Exam  Patient is alert, oriented, no adenopathy, well-dressed, normal affect, normal respiratory effort. Examination patient has a normal gait he has a negative straight leg raise bilaterally the motor strength is 5/5 and symmetric in both lower extremities.  Pain primarily over the lateral aspect of the right calf the compartment is soft nontender tibial crest and medial border nontender to palpation.  Imaging: No results found. No images are attached to the encounter.  Labs: No results found for: HGBA1C, ESRSEDRATE, CRP, LABURIC, REPTSTATUS, GRAMSTAIN, CULT, LABORGA   No results found for: ALBUMIN, PREALBUMIN, LABURIC  No results found for: MG No results found for: VD25OH  No results found for: PREALBUMIN No flowsheet data found.   Body mass index is 21.62 kg/m.  Orders:  Orders Placed This Encounter  Procedures  . XR Lumbar Spine 2-3 Views   Meds ordered this encounter  Medications  . predniSONE (DELTASONE) 10 MG tablet    Sig: Take 2 tablets (20 mg total) by mouth  daily with breakfast.    Dispense:  60 tablet    Refill:  0     Procedures: No procedures performed  Clinical Data: No additional findings.  ROS:  All other systems negative, except as noted in the HPI. Review of Systems  Objective: Vital Signs: Ht 5\' 11"  (1.803 m)   Wt 155 lb (70.3 kg)   BMI 21.62 kg/m   Specialty Comments:  No specialty comments available.  PMFS History: There are no problems to display for this patient.  History reviewed. No pertinent past medical history.  History reviewed. No pertinent family history.  History reviewed. No pertinent surgical history. Social History   Occupational History  . Not on file  Tobacco Use  . Smoking status: Current Every Day Smoker  . Smokeless tobacco: Never Used  Substance and Sexual Activity  . Alcohol use: Yes  . Drug use: Never  . Sexual activity: Not on file

## 2020-08-25 ENCOUNTER — Ambulatory Visit: Payer: Medicare Other | Admitting: Physician Assistant

## 2020-08-25 ENCOUNTER — Encounter: Payer: Self-pay | Admitting: Orthopedic Surgery

## 2020-08-25 VITALS — Ht 71.0 in | Wt 155.0 lb

## 2020-08-25 DIAGNOSIS — M5441 Lumbago with sciatica, right side: Secondary | ICD-10-CM

## 2020-08-25 DIAGNOSIS — G8929 Other chronic pain: Secondary | ICD-10-CM | POA: Diagnosis not present

## 2020-08-25 MED ORDER — PREDNISONE 10 MG PO TABS: 20.0000 mg | ORAL_TABLET | Freq: Every day | ORAL | 0 refills | Status: DC

## 2020-08-25 NOTE — Progress Notes (Signed)
Office Visit Note   Patient: Thomas Sharp           Date of Birth: [birth year]           MRN: [id number] Visit Date: 08/25/2020              Requested by: Renaye Rakers, MD [address] [place],  [place] [address] PCP: Renaye Rakers, MD  Chief Complaint  Patient presents with  . Lower Back - Follow-up    With RLE s/s      HPI: This is a pleasant 74 year old gentleman who is here to follow-up on his lower back pain.  These are symptoms that radiate into his right buttock and his lateral right calf.  He has been taking prednisone and has significantly helped.  His buttock pain has disappeared and his lateral leg pain is much better.  He thinks this all began when he has been standing awkwardly working on a project at home  Assessment & Plan: Visit Diagnoses: No diagnosis found.  Plan: I had a long discussion with he and his wife about the options.  He would like to continue with the prednisone but will wean down on it to 1 pill a day to half a pill or 1 every other day.  He knows he should not suddenly stop this medication.  If it anytime he has a significant return of symptoms he could contact us for an MRI for possible ESI though he is not interested in pursuing this at this point  Follow-Up Instructions: No follow-ups on file.   Ortho Exam  Patient is alert, oriented, no adenopathy, well-dressed, normal affect, normal respiratory effort. He has excellent 5 out of 5 strength with dorsiflexion plantarflexion no straight leg raise.  Still some minor mild tenderness over the lateral calf but calf is soft nontender negative Denna Haggard' sign pain is relieved with bending forward no changes with extending his back.  Cannot appreciate any weakness  Imaging: No results found. No images are attached to the encounter.  Labs: No results found for: HGBA1C, ESRSEDRATE, CRP, LABURIC, REPTSTATUS, GRAMSTAIN, CULT, LABORGA   No results found for: ALBUMIN, PREALBUMIN, LABURIC  No results  found for: MG No results found for: VD25OH  No results found for: PREALBUMIN No flowsheet data found.   Body mass index is 21.62 kg/m.  Orders:  No orders of the defined types were placed in this encounter.  Meds ordered this encounter  Medications  . predniSONE (DELTASONE) 10 MG tablet    Sig: Take 2 tablets (20 mg total) by mouth daily with breakfast.    Dispense:  60 tablet    Refill:  0     Procedures: No procedures performed  Clinical Data: No additional findings.  ROS:  All other systems negative, except as noted in the HPI. Review of Systems  Objective: Vital Signs: Ht 5\' 11"  (1.803 m)   Wt 155 lb (70.3 kg)   BMI 21.62 kg/m   Specialty Comments:  No specialty comments available.  PMFS History: There are no problems to display for this patient.  No past medical history on file.  No family history on file.  No past surgical history on file. Social History   Occupational History  . Not on file  Tobacco Use  . Smoking status: Current Every Day Smoker  . Smokeless tobacco: Never Used  Substance and Sexual Activity  . Alcohol use: Yes  . Drug use: Never  . Sexual activity: Not on file

## 2021-06-25 DIAGNOSIS — J3081 Allergic rhinitis due to animal (cat) (dog) hair and dander: Secondary | ICD-10-CM | POA: Insufficient documentation

## 2021-06-25 DIAGNOSIS — J301 Allergic rhinitis due to pollen: Secondary | ICD-10-CM | POA: Insufficient documentation

## 2021-06-25 DIAGNOSIS — J309 Allergic rhinitis, unspecified: Secondary | ICD-10-CM | POA: Insufficient documentation

## 2021-07-01 DIAGNOSIS — G250 Essential tremor: Secondary | ICD-10-CM | POA: Insufficient documentation

## 2023-10-05 ENCOUNTER — Other Ambulatory Visit: Payer: Self-pay | Admitting: Family Medicine

## 2023-10-05 DIAGNOSIS — Z720 Tobacco use: Secondary | ICD-10-CM

## 2023-10-05 DIAGNOSIS — F17211 Nicotine dependence, cigarettes, in remission: Secondary | ICD-10-CM

## 2023-10-19 ENCOUNTER — Ambulatory Visit
Admission: RE | Admit: 2023-10-19 | Discharge: 2023-10-19 | Disposition: A | Discharge status: Home / Self Care | Payer: Medicare Other | Source: Ambulatory Visit | Attending: Family Medicine | Admitting: Family Medicine

## 2023-10-19 DIAGNOSIS — Z720 Tobacco use: Secondary | ICD-10-CM

## 2023-10-19 DIAGNOSIS — F17211 Nicotine dependence, cigarettes, in remission: Secondary | ICD-10-CM

## 2024-01-01 ENCOUNTER — Other Ambulatory Visit: Payer: Self-pay | Admitting: Urology

## 2024-01-01 DIAGNOSIS — R972 Elevated prostate specific antigen [PSA]: Secondary | ICD-10-CM

## 2024-01-02 ENCOUNTER — Ambulatory Visit
Admission: RE | Admit: 2024-01-02 | Discharge: 2024-01-02 | Disposition: A | Discharge status: Home / Self Care | Payer: Medicare Other | Source: Ambulatory Visit | Attending: Urology | Admitting: Urology

## 2024-01-02 DIAGNOSIS — R972 Elevated prostate specific antigen [PSA]: Secondary | ICD-10-CM

## 2024-01-02 MED ORDER — GADOPICLENOL 0.5 MMOL/ML IV SOLN
7.0000 mL | Freq: Once | INTRAVENOUS | Status: AC | PRN
  Administered 2024-01-02: 7 mL via INTRAVENOUS

## 2024-01-29 DIAGNOSIS — R972 Elevated prostate specific antigen [PSA]: Secondary | ICD-10-CM

## 2024-01-29 HISTORY — DX: Elevated prostate specific antigen (PSA): R97.20

## 2024-02-13 ENCOUNTER — Other Ambulatory Visit (HOSPITAL_COMMUNITY): Payer: Self-pay | Admitting: Urology

## 2024-02-13 DIAGNOSIS — C61 Malignant neoplasm of prostate: Secondary | ICD-10-CM

## 2024-02-19 ENCOUNTER — Encounter: Payer: Self-pay | Admitting: Radiation Oncology

## 2024-02-19 NOTE — Progress Notes (Addendum)
 GU Location of Tumor / Histology: Prostate Ca  If Prostate Cancer, Gleason Score is (4 + 4) and PSA is (16.0 on 12/19/2023).  PSA 16.41 on 10/06/2023  Thomas Sharp presented as referral from Dr. Rhoderick Moody [facility]) elevated PSA.  Biopsies     02/22/2024 Dr. Cristal Deer Liliane Shi NM PET (PSMA) Skull to Mid Thigh CLINICAL DATA:  Prostate carcinoma. High risk risk prostate carcinoma. Gleason 4+ 5 equal 9 prostate adenocarcinoma  IMPRESSION: 1. Intense radiotracer activity within the RIGHT and LEFT lateral mid gland consistent with primary prostate adenocarcinoma. 2. No evidence of metastatic adenopathy in the pelvis or periaortic retroperitoneum. 3. No evidence of visceral metastasis or skeletal metastasis.     01/02/2024 Dr. Cristal Deer Liliane Shi MR Prostate with/without Contrast CLINICAL DATA:  Elevated PSA level. R97.20   IMPRESSION: 1. PI-RADS category 4 lesion of the left posterolateral peripheral zone at the apex. 2. Two PI-RADS category 3 lesions of the right peripheral zone. 3.  Targeting data sent to [facility]. 4. Mild prostatomegaly and benign prostatic hypertrophy.    Past/Anticipated interventions by urology, if any: NA  Past/Anticipated interventions by medical oncology, if any: NA  Weight changes, if any: No  IPSS:  9 SHIM:  21  Bowel/Bladder complaints, if any:  No  Nausea/Vomiting, if any:  No  Pain issues, if any:  0/10  SAFETY ISSUES: Prior radiation? No Pacemaker/ICD? No Possible current pregnancy? Male Is the patient on methotrexate? No  Current Complaints / other details:

## 2024-02-22 ENCOUNTER — Encounter (HOSPITAL_COMMUNITY)
Admission: RE | Admit: 2024-02-22 | Discharge: 2024-02-22 | Disposition: A | Discharge status: Home / Self Care | Source: Ambulatory Visit | Attending: Urology | Admitting: Urology

## 2024-02-22 DIAGNOSIS — C61 Malignant neoplasm of prostate: Secondary | ICD-10-CM | POA: Insufficient documentation

## 2024-02-22 MED ORDER — FLOTUFOLASTAT F 18 GALLIUM 296-5846 MBQ/ML IV SOLN
8.0000 | Freq: Once | INTRAVENOUS | Status: AC
  Administered 2024-02-22: 8.5 via INTRAVENOUS
  Filled 2024-02-22: qty 8

## 2024-02-27 NOTE — Progress Notes (Signed)
 Radiation Oncology         [phone] ________________________________  Initial Outpatient Consultation  Name: Thomas Sharp MRN: [id number]  Date: 02/28/2024  DOB: 1946/09/16  WC:BJSEG, Adrian Saran, MD  Shelly Rubenstein*   REFERRING PHYSICIAN: Shelly Rubenstein*  DIAGNOSIS: 78 y.o. gentleman with Stage T1c adenocarcinoma of the prostate with Gleason score of 4+5, and PSA of 16.  No diagnosis found.  HISTORY OF PRESENT ILLNESS: Thomas Sharp is a 78 y.o. male with a diagnosis of prostate cancer. He was noted to have an elevated PSA of 16.41 by his primary care physician, Dr. Parke Simmers.  Accordingly, he was referred for evaluation in urology by Dr. Liliane Shi on 12/18/23. A repeat PSA was obtained that day and showed stability at 16. He underwent prostate MRI on 01/02/24 showing: PI-RADS 4 lesion of left posterolateral peripheral zone at apex; two PI-RADS 3 lesions in right peripheral zone. The patient proceeded to transrectal ultrasound with 12 biopsies of the prostate on 01/29/24.  The prostate volume measured 44 cc.  Out of 23 core biopsies, 15 were positive.  The maximum Gleason score was 4+5, and this was seen in 3/4 cores from right ROI #2 (with perineural invasion). Additionally, Gleason 4+4 was seen in all three cores from right ROI #1 (with PNI), right mid lateral, and right apex lateral, Gleason 4+3 in all four cores from left ROI and left apex lateral (all with PNI), and Gleason 3+4 in left mid lateral (small focus) and left apex.  He underwent staging PSMA PET scan on 02/22/24 showing ***  The patient reviewed the biopsy results with his urologist and he has kindly been referred today for discussion of potential radiation treatment options.   PREVIOUS RADIATION THERAPY: No  PAST MEDICAL HISTORY:  Past Medical History:  Diagnosis Date   Elevated PSA 01/29/2024   12/18/2023      PAST SURGICAL HISTORY: Past Surgical History:  Procedure Laterality Date   PROSTATE BIOPSY       FAMILY HISTORY: No family history on file.  SOCIAL HISTORY:  Social History   Socioeconomic History   Marital status: Married    Spouse name: Not on file   Number of children: Not on file   Years of education: Not on file   Highest education level: Not on file  Occupational History   Not on file  Tobacco Use   Smoking status: Every Day   Smokeless tobacco: Never  Substance and Sexual Activity   Alcohol use: Yes   Drug use: Never   Sexual activity: Not on file  Other Topics Concern   Not on file  Social History Narrative   Not on file   Social Drivers of Health   Financial Resource Strain: Not on file  Food Insecurity: Not on file  Transportation Needs: Not on file  Physical Activity: Not on file  Stress: Not on file  Social Connections: Not on file  Intimate Partner Violence: Not on file    ALLERGIES: Patient has no known allergies.  MEDICATIONS:  No current outpatient medications on file.   No current facility-administered medications for this encounter.    REVIEW OF SYSTEMS:  On review of systems, the patient reports that he is doing well overall. He denies any chest pain, shortness of breath, cough, fevers, chills, night sweats, unintended weight changes. He denies any bowel disturbances, and denies abdominal pain, nausea or vomiting. He denies any new musculoskeletal or joint aches or pains. His IPSS was ***, indicating *** urinary symptoms.  His SHIM was ***, indicating he {does not have/has mild/moderate/severe} erectile dysfunction. A complete review of systems is obtained and is otherwise negative.    PHYSICAL EXAM:  Wt Readings from Last 3 Encounters:  08/25/20 155 lb (70.3 kg)  07/28/20 155 lb (70.3 kg)  05/22/18 155 lb (70.3 kg)   Temp Readings from Last 3 Encounters:  05/22/18 98.1 F (36.7 C) (Oral)   BP Readings from Last 3 Encounters:  05/22/18 132/71   Pulse Readings from Last 3 Encounters:  05/22/18 77    /10  In general this is a  well appearing *** male in no acute distress. He's alert and oriented x4 and appropriate throughout the examination. Cardiopulmonary assessment is negative for acute distress, and he exhibits normal effort.     KPS = ***  100 - Normal; no complaints; no evidence of disease. 90   - Able to carry on normal activity; minor signs or symptoms of disease. 80   - Normal activity with effort; some signs or symptoms of disease. 24   - Cares for self; unable to carry on normal activity or to do active work. 60   - Requires occasional assistance, but is able to care for most of his personal needs. 50   - Requires considerable assistance and frequent medical care. 40   - Disabled; requires special care and assistance. 30   - Severely disabled; hospital admission is indicated although death not imminent. 20   - Very sick; hospital admission necessary; active supportive treatment necessary. 10   - Moribund; fatal processes progressing rapidly. 0     - Dead  Karnofsky DA, Abelmann WH, Craver LS and Burchenal JH [phone]) The use of the nitrogen mustards in the palliative treatment of carcinoma: with particular reference to bronchogenic carcinoma Cancer 1 [phone]  LABORATORY DATA:  No results found for: "WBC", "HGB", "HCT", "MCV", "PLT" No results found for: "NA", "K", "CL", "CO2" No results found for: "ALT", "AST", "GGT", "ALKPHOS", "BILITOT"   RADIOGRAPHY: No results found.    IMPRESSION/PLAN: 1. 78 y.o. gentleman with Stage T1c adenocarcinoma of the prostate with Gleason Score of 4+5, and PSA of 16. We discussed the patient's workup and outlined the nature of prostate cancer in this setting. The patient's T stage, Gleason's score, and PSA put him into the high risk group. Accordingly, he is eligible for a variety of potential treatment options including LT-ADT concurrent with 8 weeks of external radiation, 5 weeks of external radiation with an upfront brachytherapy boost, or prostatectomy. We discussed  the available radiation techniques, and focused on the details and logistics of delivery. We discussed and outlined the risks, benefits, short and long-term effects associated with radiotherapy and compared and contrasted these with prostatectomy. We discussed the role of SpaceOAR gel in reducing the rectal toxicity associated with radiotherapy. We also detailed the role of ADT in the treatment of high risk prostate cancer and outlined the associated side effects that could be expected with this therapy. He appears to have a good understanding of his disease and our treatment recommendations which are of curative intent.  He was encouraged to ask questions that were answered to his stated satisfaction.  At the conclusion of our conversation, the patient is interested in moving forward with ***.  We personally spent *** minutes in this encounter including chart review, reviewing radiological studies, meeting face-to-face with the patient, entering orders and completing documentation.    Marguarite Arbour, PA-C    Margaretmary Dys, MD  [facility]  Radiation Oncology Direct Dial: [phone]  Fax: [phone] [email or web address]  Skype  LinkedIn   This document serves as a record of services personally performed by Margaretmary Dys, MD and Marcello Fennel, PA-C. It was created on their behalf by Mickie Bail, a trained medical scribe. The creation of this record is based on the scribe's personal observations and the provider's statements to them. This document has been checked and approved by the attending provider.

## 2024-02-28 ENCOUNTER — Ambulatory Visit
Admission: RE | Admit: 2024-02-28 | Discharge: 2024-02-28 | Disposition: A | Discharge status: Home / Self Care | Source: Ambulatory Visit | Attending: Radiation Oncology | Admitting: Radiation Oncology

## 2024-02-28 ENCOUNTER — Encounter: Payer: Self-pay | Admitting: Radiation Oncology

## 2024-02-28 ENCOUNTER — Telehealth: Payer: Self-pay | Admitting: *Deleted

## 2024-02-28 VITALS — BP 135/57 | HR 81 | Temp 98.3°F | Resp 19 | Wt 163.6 lb

## 2024-02-28 DIAGNOSIS — C61 Malignant neoplasm of prostate: Secondary | ICD-10-CM | POA: Insufficient documentation

## 2024-02-28 DIAGNOSIS — K7689 Other specified diseases of liver: Secondary | ICD-10-CM | POA: Insufficient documentation

## 2024-02-28 DIAGNOSIS — I700 Atherosclerosis of aorta: Secondary | ICD-10-CM | POA: Diagnosis not present

## 2024-02-28 DIAGNOSIS — F17210 Nicotine dependence, cigarettes, uncomplicated: Secondary | ICD-10-CM | POA: Insufficient documentation

## 2024-02-28 HISTORY — DX: Malignant neoplasm of prostate: C61

## 2024-02-28 NOTE — Telephone Encounter (Signed)
 Called patient's wife- Andrey Campanile to inform of ADT appt. for tomorrow (02/29/24 - arrival time- 3 pm)@ Dr. Alphonsa Overall Office, lvm for a return call

## 2024-03-01 NOTE — Progress Notes (Signed)
 Spoke with patient via telephone to introduce myself as the prostate nurse navigator and discussed my role.  Patient did receive Eligard 45mg  on 3/27.  I will follow up with a my chart message to patient to provided some written education on next steps with treatment recommendations.  I provided my contact information and asked for him to call me with questions or concerns.  Verbalized understanding.  Plan of care in progress.

## 2024-03-11 ENCOUNTER — Other Ambulatory Visit: Payer: Self-pay | Admitting: Urology

## 2024-03-11 ENCOUNTER — Telehealth: Payer: Self-pay | Admitting: *Deleted

## 2024-03-11 NOTE — Telephone Encounter (Signed)
 CALLED PATIENT TO INFORM OF PRE-SEED APPTS. AND IMPLANT DATE, SPOKE WITH PATIENT AND HE IS AWARE OF THESE APPTS.

## 2024-05-01 ENCOUNTER — Encounter: Payer: Self-pay | Admitting: Urology

## 2024-05-01 ENCOUNTER — Telehealth: Payer: Self-pay | Admitting: *Deleted

## 2024-05-01 NOTE — Telephone Encounter (Signed)
 CALLED PATIENT TO REMIND OF PRE-SEED APPTS. FOR 05-02-24, SPOKE WITH PATIENT AND HE IS AWARE OF THESE APPTS.

## 2024-05-01 NOTE — Progress Notes (Signed)
 Pre-seed nursing interview for a diagnosis of Stage T1c adenocarcinoma of the prostate with Gleason score of 4+5, and PSA of 16.  Patient identity verified x2.   Patient states issues as follows...  -Pain: Denies -Fatigue: Denies -Abdomen: Denies -Groin: Denies -Urinary: Denies -Bowels: Denies -Appetite: Good  SN: Hot flashes  Patient denies all other related issues at this time.  Meaningful use complete.  Urinary Management medication(s)- None Urology appointment date- 08/26/2024, with Dr. Sherrine Dolly at [facility]  No vitals needed for this visit.  This concludes the interaction.  Avery Bodo, LPN

## 2024-05-01 NOTE — Patient Instructions (Signed)
 SURGICAL WAITING ROOM VISITATION Patients having surgery or a procedure may have no more than 2 support people in the waiting area - these visitors may rotate in the visitor waiting room.   If the patient needs to stay at the hospital during part of their recovery, the visitor guidelines for inpatient rooms apply.  PRE-OP VISITATION  Pre-op nurse will coordinate an appropriate time for 1 support person to accompany the patient in pre-op.  This support person may not rotate.  This visitor will be contacted when the time is appropriate for the visitor to come back in the pre-op area.  Please refer to the [facility] website for the visitor guidelines for Inpatients (after your surgery is over and you are in a regular room).  You are not required to quarantine at this time prior to your surgery. However, you must do this: Hand Hygiene often Do NOT share personal items Notify your provider if you are in close contact with someone who has COVID or you develop fever 100.4 or greater, new onset of sneezing, cough, sore throat, shortness of breath or body aches.  If you test positive for Covid or have been in contact with anyone that has tested positive in the last 10 days please notify you surgeon.    Your procedure is scheduled on: TUESDAY  May 21, 2024   Report to [facility] Main Entrance: [address] entrance where the [employer] is available.   Report to admitting at:  05:15   AM  Call this number if you have any questions or problems the morning of surgery [phone]  DO NOT EAT OR DRINK ANYTHING AFTER MIDNIGHT THE NIGHT PRIOR TO YOUR SURGERY / PROCEDURE.   FOLLOW  ANY ADDITIONAL PRE OP INSTRUCTIONS YOU RECEIVED FROM YOUR SURGEON'S OFFICE!!!  FLEET ENEMA: Obtain one(1) Fleet Enema (sodium phosphate 7-19 gm / 118 ml enema) and use (according to the directions on the box) the night prior to your surgery.   If you have any questions, please contact your Surgeon's office for  additional information.    Oral Hygiene is also important to reduce your risk of infection.        Remember - BRUSH YOUR TEETH THE MORNING OF SURGERY WITH YOUR REGULAR TOOTHPASTE  Do NOT smoke after Midnight the night before surgery.  STOP TAKING all Vitamins, Herbs and supplements 1 week before your surgery.   Take ONLY these medicines the morning of surgery with A SIP OF WATER: none                    You may not have any metal on your body including jewelry, and body piercing  Do not wear  lotions, powders,  cologne, or deodorant  Men may shave face and neck.  Contacts, Hearing Aids, dentures or bridgework may not be worn into surgery. DENTURES WILL BE REMOVED PRIOR TO SURGERY PLEASE DO NOT APPLY "Poly grip" OR ADHESIVES!!!  Patients discharged on the day of surgery will not be allowed to drive home.  Someone NEEDS to stay with you for the first 24 hours after anesthesia.  Do not bring your home medications to the hospital. The Pharmacy will dispense medications listed on your medication list to you during your admission in the Hospital.  Special Instructions: Bring a copy of your healthcare power of attorney and living will documents the day of surgery, if you wish to have them scanned into your [facility] Records- EPIC  Please read over the  following fact sheets you were given: IF YOU HAVE QUESTIONS ABOUT YOUR PRE-OP INSTRUCTIONS, PLEASE CALL [phone].   [facility] - Preparing for Surgery Before surgery, you can play an important role.  Because skin is not sterile, your skin needs to be as free of germs as possible.  You can reduce the number of germs on your skin by washing with CHG (chlorahexidine gluconate) soap before surgery.  CHG is an antiseptic cleaner which kills germs and bonds with the skin to continue killing germs even after washing. Please DO NOT use if you have an allergy to CHG or antibacterial soaps.  If your skin becomes reddened/irritated stop using  the CHG and inform your nurse when you arrive at Short Stay. Do not shave (including legs and underarms) for at least 48 hours prior to the first CHG shower.  You may shave your face/neck.  Please follow these instructions carefully:  1.  Shower with CHG Soap the night before surgery and the  morning of surgery.  2.  If you choose to wash your hair, wash your hair first as usual with your normal  shampoo.  3.  After you shampoo, rinse your hair and body thoroughly to remove the shampoo.                             4.  Use CHG as you would any other liquid soap.  You can apply chg directly to the skin and wash.  Gently with a scrungie or clean washcloth.  5.  Apply the CHG Soap to your body ONLY FROM THE NECK DOWN.   Do not use on face/ open                           Wound or open sores. Avoid contact with eyes, ears mouth and genitals (private parts).                       Wash face,  Genitals (private parts) with your normal soap.             6.  Wash thoroughly, paying special attention to the area where your  surgery  will be performed.  7.  Thoroughly rinse your body with warm water from the neck down.  8.  DO NOT shower/wash with your normal soap after using and rinsing off the CHG Soap.            9.  Pat yourself dry with a clean towel.            10.  Wear clean pajamas.            11.  Place clean sheets on your bed the night of your first shower and do not  sleep with pets.  ON THE DAY OF SURGERY : Do not apply any lotions/deodorants the morning of surgery.  Please wear clean clothes to the hospital/surgery center.    FAILURE TO FOLLOW THESE INSTRUCTIONS MAY RESULT IN THE CANCELLATION OF YOUR SURGERY  PATIENT SIGNATURE_________________________________  NURSE SIGNATURE__________________________________  ________________________________________________________________________

## 2024-05-01 NOTE — Progress Notes (Signed)
 COVID Vaccine received:  []  No [x]  Yes Date of any COVID positive Test in last 90 days:  PCP - Jonathon Neighbors, MD  Cardiologist - none Radiation Oncology-  Kenith Payer, MD  Chest x-ray -  EKG -   Stress Test -  ECHO -  Cardiac Cath -  CT Coronary Calcium score:   Bowel Prep - []  No  [x]   Yes __1 Fleet enema_night prior___  Pacemaker / ICD device [x]  No []  Yes   Spinal Cord Stimulator:[x]  No []  Yes       History of Sleep Apnea? [x]  No []  Yes   CPAP used?- [x]  No []  Yes    Does the patient monitor blood sugar?   [x]  N/A   []  No []  Yes  Patient has: [x]  NO Hx DM   []  Pre-DM   []  DM1  []   DM2  Blood Thinner / Instructions:  none Aspirin Instructions:  none  ERAS Protocol Ordered: [x]  No  []  Yes Patient is to be NPO after: MN  Dental hx: []  Dentures:  []  N/A      []  Bridge or Partial:                   []  Loose or Damaged teeth:   Comments:   Activity level: Able to walk up 2 flights of stairs without becoming significantly short of breath or having chest pain?  []  No   []    Yes   Anesthesia review: smoker, essential tremors both hands, HTN- NO MEDS  Patient denies shortness of breath, fever, cough and chest pain at PAT appointment.  Patient verbalized understanding and agreement to the Pre-Surgical Instructions that were given to them at this PAT appointment. Patient was also educated of the need to review these PAT instructions again prior to his surgery.I reviewed the appropriate phone numbers to call if they have any and questions or concerns.

## 2024-05-02 ENCOUNTER — Ambulatory Visit
Admission: RE | Admit: 2024-05-02 | Discharge: 2024-05-02 | Disposition: A | Discharge status: Home / Self Care | Source: Ambulatory Visit | Attending: Radiation Oncology | Admitting: Radiation Oncology

## 2024-05-02 ENCOUNTER — Ambulatory Visit
Admission: RE | Admit: 2024-05-02 | Discharge: 2024-05-02 | Disposition: A | Discharge status: Home / Self Care | Payer: Self-pay | Source: Ambulatory Visit | Attending: Urology | Admitting: Urology

## 2024-05-02 ENCOUNTER — Encounter (HOSPITAL_COMMUNITY)
Admission: RE | Admit: 2024-05-02 | Discharge: 2024-05-02 | Disposition: A | Discharge status: Home / Self Care | Source: Ambulatory Visit | Attending: Urology

## 2024-05-02 ENCOUNTER — Other Ambulatory Visit: Payer: Self-pay

## 2024-05-02 ENCOUNTER — Encounter (HOSPITAL_COMMUNITY): Payer: Self-pay

## 2024-05-02 VITALS — Wt 165.0 lb

## 2024-05-02 VITALS — BP 134/54 | HR 76 | Temp 97.8°F | Resp 16 | Ht 71.0 in | Wt 164.9 lb

## 2024-05-02 DIAGNOSIS — I10 Essential (primary) hypertension: Secondary | ICD-10-CM | POA: Insufficient documentation

## 2024-05-02 DIAGNOSIS — C61 Malignant neoplasm of prostate: Secondary | ICD-10-CM

## 2024-05-02 DIAGNOSIS — Z01818 Encounter for other preprocedural examination: Secondary | ICD-10-CM

## 2024-05-02 HISTORY — DX: Pneumonia, unspecified organism: J18.9

## 2024-05-02 HISTORY — DX: Essential (primary) hypertension: I10

## 2024-05-02 HISTORY — DX: Cardiac murmur, unspecified: R01.1

## 2024-05-02 HISTORY — DX: Unspecified osteoarthritis, unspecified site: M19.90

## 2024-05-02 HISTORY — DX: Myoneural disorder, unspecified: G70.9

## 2024-05-02 LAB — CBC
HCT: 41.9 % (ref 39.0–52.0)
Hemoglobin: 13.1 g/dL (ref 13.0–17.0)
MCH: 31 pg (ref 26.0–34.0)
MCHC: 31.3 g/dL (ref 30.0–36.0)
MCV: 99.1 fL (ref 80.0–100.0)
Platelets: 265 10*3/uL (ref 150–400)
RBC: 4.23 MIL/uL (ref 4.22–5.81)
RDW: 15.6 % — ABNORMAL HIGH (ref 11.5–15.5)
WBC: 6.1 10*3/uL (ref 4.0–10.5)
nRBC: 0 % (ref 0.0–0.2)

## 2024-05-02 LAB — BASIC METABOLIC PANEL WITH GFR
Anion gap: 6 (ref 5–15)
BUN: 30 mg/dL — ABNORMAL HIGH (ref 8–23)
CO2: 28 mmol/L (ref 22–32)
Calcium: 9.3 mg/dL (ref 8.9–10.3)
Chloride: 103 mmol/L (ref 98–111)
Creatinine, Ser: 1.21 mg/dL (ref 0.61–1.24)
GFR, Estimated: >60 mL/min (ref >60)
Glucose, Bld: 128 mg/dL — ABNORMAL HIGH (ref 70–99)
Potassium: 4 mmol/L (ref 3.5–5.1)
Sodium: 137 mmol/L (ref 135–145)

## 2024-05-02 NOTE — Progress Notes (Signed)
  Radiation Oncology         [phone] ________________________________  Name: Thomas Sharp MRN: [id number]  Date: 05/02/2024  DOB: May 06, 1946  SIMULATION AND TREATMENT PLANNING NOTE PUBIC ARCH STUDY  WJ:XBJYN, [name], MD  Jonathon Neighbors, MD  DIAGNOSIS: 78 y.o. gentleman with Stage T1c adenocarcinoma of the prostate with Gleason score of 4+5, and PSA of 16.   Oncology History  Prostate cancer (HCC)  01/29/2024 Cancer Staging   Staging form: Prostate, AJCC 8th Edition - Clinical stage from 01/29/2024: Stage IIIC (cT1c, cN0, cM0, PSA: 16, Grade Group: 5) - Signed by Keitha Pata, PA-C on 02/28/2024 Histopathologic type: Adenocarcinoma, NOS Stage prefix: Initial diagnosis Prostate specific antigen (PSA) range: 10 to 19 Gleason primary pattern: 4 Gleason secondary pattern: 5 Gleason score: 9 Histologic grading system: 5 grade system Number of biopsy cores examined: 23 Number of biopsy cores positive: 15 Location of positive needle core biopsies: Both sides   02/28/2024 Initial Diagnosis   Prostate cancer (HCC)       ICD-10-CM   1. Prostate cancer (HCC)  C61       COMPLEX SIMULATION:  The patient presented today for evaluation for possible prostate seed implant. He was brought to the radiation planning suite and placed supine on the CT couch. A 3-dimensional image study set was obtained in upload to the planning computer. There, on each axial slice, I contoured the prostate gland. Then, using three-dimensional radiation planning tools I reconstructed the prostate in view of the structures from the transperineal needle pathway to assess for possible pubic arch interference. In doing so, I did not appreciate any pubic arch interference. Also, the patient's prostate volume was estimated based on the drawn structure. The volume was 36 cc.  Given the pubic arch appearance and prostate volume, patient remains a good candidate to proceed with prostate seed implant. Today, he freely provided  informed written consent to proceed.    PLAN: The patient will undergo prostate seed implant boost to be followed by IMRT, concurrent with LT-ADT (received 6 month Eligard 02/29/24).   ________________________________  Trilby Fujisawa Lorri Rota, M.D.

## 2024-05-02 NOTE — Progress Notes (Signed)
 Radiation Oncology         [phone] ________________________________  Outpatient Follow up- Pre-seed visit  Name: Thomas Sharp MRN: [id number]  Date: 05/02/2024  DOB: 1946-01-16  OZ:HYQMV, [name], MD  Jonathon Neighbors, MD   REFERRING PHYSICIAN: Jonathon Neighbors, MD  DIAGNOSIS: 78 y.o. gentleman with Stage T1c adenocarcinoma of the prostate with Gleason score of 4+5, and PSA of 16.     ICD-10-CM   1. Malignant neoplasm of prostate (HCC)  C61     2. Prostate cancer [facility])  C61       HISTORY OF PRESENT ILLNESS: Thomas Sharp is a 78 y.o. male with a diagnosis of prostate cancer.  He was noted to have an elevated PSA of 16.41 by his primary care physician, Dr. Nida Barrow.  Accordingly, he was referred for evaluation in urology by Dr. Sherrine Dolly on 12/18/23. A repeat PSA obtained that day remained stable elevated at 16. He underwent prostate MRI on 01/02/24 showing a PI-RADS 4 lesion in the left posterolateral peripheral zone at the apex and two PI-RADS 3 lesions in the right peripheral zone. The patient proceeded to transrectal ultrasound with 23 biopsies of the prostate on 01/29/24.  The prostate volume measured 44 cc.  Out of 23 core biopsies, 15 were positive.  The maximum Gleason score was 4+5, and this was seen in 3/4 cores from right ROI #2 (with perineural invasion). Additionally, Gleason 4+4 was seen in all three cores from right ROI #1 (with PNI), right mid lateral, and right apex lateral, Gleason 4+3 in all four cores from left ROI and left apex lateral (all with PNI), and Gleason 3+4 in left mid lateral (small focus) and left apex.   He underwent staging PSMA PET scan on 02/22/24 and this confirms localized prostate cancer without evidence of disease outside of the prostate.  The patient reviewed the biopsy results with his urologist and was kindly referred to us  for discussion of potential radiation treatment options. We initially met the patient on 02/28/24 and he was most interested in proceeding  with  brachytherapy boost and use of SpaceOAR gel followed by a 5 week course of daily radiotherapy concurrent with LT-ADT for treatment of his disease. He received a 6 month Eligard injection 02/29/24 and e is here today for his pre-procedure imaging for planning and to answer any additional questions he may have about this treatment.   PREVIOUS RADIATION THERAPY: No  PAST MEDICAL HISTORY:  Past Medical History:  Diagnosis Date   Elevated PSA 01/29/2024   12/18/2023   Prostate cancer (HCC)       PAST SURGICAL HISTORY: Past Surgical History:  Procedure Laterality Date   PROSTATE BIOPSY      FAMILY HISTORY: No family history on file.  SOCIAL HISTORY:  Social History   Socioeconomic History   Marital status: Married    Spouse name: Not on file   Number of children: Not on file   Years of education: Not on file   Highest education level: Not on file  Occupational History   Not on file  Tobacco Use   Smoking status: Every Day   Smokeless tobacco: Never  Vaping Use   Vaping status: Never Used  Substance and Sexual Activity   Alcohol use: Yes   Drug use: Never   Sexual activity: Not on file  Other Topics Concern   Not on file  Social History Narrative   Not on file   Social Drivers of Health   Financial Resource Strain: Not  on file  Food Insecurity: No Food Insecurity (05/01/2024)   Hunger Vital Sign    Worried About Running Out of Food in the Last Year: Never true    Ran Out of Food in the Last Year: Never true  Transportation Needs: No Transportation Needs (05/01/2024)   PRAPARE - Administrator, Civil Service (Medical): No    Lack of Transportation (Non-Medical): No  Physical Activity: Not on file  Stress: Not on file  Social Connections: Not on file  Intimate Partner Violence: Not At Risk (05/01/2024)   Humiliation, Afraid, Rape, and Kick questionnaire    Fear of Current or Ex-Partner: No    Emotionally Abused: No    Physically Abused: No     Sexually Abused: No    ALLERGIES: Patient has no known allergies.  MEDICATIONS:  Current Outpatient Medications  Medication Sig Dispense Refill   EPINEPHrine (EPIPEN 2-PAK) 0.3 mg/0.3 mL IJ SOAJ injection Inject 0.3 mg into the muscle as needed for anaphylaxis.     Multiple Vitamin (MULTIVITAMIN WITH MINERALS) TABS tablet Take 1 tablet by mouth in the morning.     NON FORMULARY Inject 1 Dose as directed every 30 (thirty) days.     No current facility-administered medications for this encounter.    REVIEW OF SYSTEMS:   On review of systems, the patient reports that he is doing well overall. He denies any chest pain, shortness of breath, cough, fevers, chills, night sweats, unintended weight changes. He denies any bowel disturbances, and denies abdominal pain, nausea or vomiting. He denies any new musculoskeletal or joint aches or pains. His IPSS was 9, indicating mild-moderate urinary symptoms. His SHIM was 21, indicating he has mild erectile dysfunction. A complete review of systems is obtained and is otherwise negative.     PHYSICAL EXAM:  Wt Readings from Last 3 Encounters:  02/28/24 163 lb 9.6 oz (74.2 kg)  08/25/20 155 lb (70.3 kg)  07/28/20 155 lb (70.3 kg)   Temp Readings from Last 3 Encounters:  02/28/24 98.3 F (36.8 C)  05/22/18 98.1 F (36.7 C) (Oral)   BP Readings from Last 3 Encounters:  02/28/24 (!) 135/57  05/22/18 132/71   Pulse Readings from Last 3 Encounters:  02/28/24 81  05/22/18 77    /10  In general this is a well appearing African American male in no acute distress. He's alert and oriented x4 and appropriate throughout the examination. Cardiopulmonary assessment is negative for acute distress, and he exhibits normal effort.     KPS = 100  100 - Normal; no complaints; no evidence of disease. 90   - Able to carry on normal activity; minor signs or symptoms of disease. 80   - Normal activity with effort; some signs or symptoms of disease. 62   -  Cares for self; unable to carry on normal activity or to do active work. 60   - Requires occasional assistance, but is able to care for most of his personal needs. 50   - Requires considerable assistance and frequent medical care. 40   - Disabled; requires special care and assistance. 30   - Severely disabled; hospital admission is indicated although death not imminent. 20   - Very sick; hospital admission necessary; active supportive treatment necessary. 10   - Moribund; fatal processes progressing rapidly. 0     - Dead  Karnofsky DA, Abelmann WH, Craver LS and Burchenal [facility] [phone]) The use of the nitrogen mustards in the palliative treatment of carcinoma:  with particular reference to bronchogenic carcinoma Cancer 1 [phone]  LABORATORY DATA:  No results found for: "WBC", "HGB", "HCT", "MCV", "PLT" No results found for: "NA", "K", "CL", "CO2" No results found for: "ALT", "AST", "GGT", "ALKPHOS", "BILITOT"   RADIOGRAPHY: No results found.    IMPRESSION/PLAN: 1. 78 y.o. gentleman with Stage T1c adenocarcinoma of the prostate with Gleason score of 4+5, and PSA of 16.  The patient has elected to proceed with  brachytherapy boost and use of SpaceOAR gel followed by a 5 week course of daily radiotherapy concurrent with LT-ADT for treatment of his disease. He has already started ADT with a 6 month Eligard injection on 02/29/24 so we reviewed the risks, benefits, short and long-term effects associated with brachytherapy and discussed the role of SpaceOAR in reducing the rectal toxicity associated with radiotherapy.  He appears to have a good understanding of his disease and our treatment recommendations which are of curative intent.  He was encouraged to ask questions that were answered to his stated satisfaction. He has freely signed written consent to proceed today in the office and a copy of this document will be placed in his medical record. His procedure is tentatively scheduled for 05/21/24 in  collaboration with Dr. Sherrine Dolly and we will see him back 2-3 weeks after his seed boost procedure and will proceed with CT SIM prostate for treatment planning in anticipation of beginning the 5 week course of daily external beam radiation 3-4 weeks after the seed boost procedure. We look forward to continuing to participate in his care. He knows that he is welcome to call with any questions or concerns at any time in the interim.  I personally spent 30 minutes in this encounter including chart review, reviewing radiological studies, meeting face-to-face with the patient, entering orders and completing documentation.    Arta Bihari, MMS, PA-C [place]  [facility] Radiation Oncology Physician Assistant Direct Dial: [phone]  Fax: [phone]

## 2024-05-20 ENCOUNTER — Telehealth: Payer: Self-pay | Admitting: *Deleted

## 2024-05-20 NOTE — Anesthesia Preprocedure Evaluation (Signed)
 Anesthesia Evaluation  Patient identified by MRN, date of birth, ID band Patient awake    Reviewed: Allergy & Precautions, NPO status , Patient's Chart, lab work & pertinent test results  Airway Mallampati: III  TM Distance: >3 FB Neck ROM: Full    Dental  (+) Upper Dentures   Pulmonary Current Smoker and Patient abstained from smoking.   Pulmonary exam normal        Cardiovascular hypertension, Normal cardiovascular exam     Neuro/Psych  Neuromuscular disease  negative psych ROS   GI/Hepatic negative GI ROS, Neg liver ROS,,,  Endo/Other  negative endocrine ROS    Renal/GU negative Renal ROS     Musculoskeletal  (+) Arthritis ,    Abdominal   Peds  Hematology negative hematology ROS (+)   Anesthesia Other Findings PROSTATE CANCER  Reproductive/Obstetrics                             Anesthesia Physical Anesthesia Plan  ASA: 2  Anesthesia Plan: General   Post-op Pain Management:    Induction: Intravenous  PONV Risk Score and Plan: 1 and Ondansetron, Dexamethasone and Treatment may vary due to age or medical condition  Airway Management Planned: LMA  Additional Equipment:   Intra-op Plan:   Post-operative Plan: Extubation in OR  Informed Consent: I have reviewed the patients History and Physical, chart, labs and discussed the procedure including the risks, benefits and alternatives for the proposed anesthesia with the patient or authorized representative who has indicated his/her understanding and acceptance.     Dental advisory given  Plan Discussed with: CRNA  Anesthesia Plan Comments:        Anesthesia Quick Evaluation

## 2024-05-20 NOTE — H&P (Signed)
 Urology Preoperative H&P   Chief Complaint: Prostate cancer   History of Present Illness: Thomas Sharp is a 78 y.o. male with T1c, grade 5 prostate cancer.   Last PSA: 16.0 (12/2023), 16.41 (09/2023). No family history of prostate cancer.  Prostate volume: 44 cm   The patient is here today for brachytherapy seed and spaceOAR placement for primary treatment of his prostate cancer.  He received his first Eligard injection on 02/29/24.  He denies interval UTIs, dysuria or hematuria.    Past Medical History:  Diagnosis Date   Arthritis    Elevated PSA 01/29/2024   12/18/2023   Heart murmur    as a child,   Hypertension    no Meds   Neuromuscular disorder (HCC)    essential tremors in both hands   Pneumonia    Prostate cancer [facility])     Past Surgical History:  Procedure Laterality Date   EYE SURGERY Left    cataract removal   PROSTATE BIOPSY      Allergies: No Known Allergies  No family history on file.  Social History:  reports that he has been smoking cigarettes. He started smoking about 50 years ago. He has a 49.2 pack-year smoking history. He has never used smokeless tobacco. He reports current alcohol use. He reports that he does not use drugs.  ROS: A complete review of systems was performed.  All systems are negative except for pertinent findings as noted.  Physical Exam:  Vital signs in last 24 hours:   Constitutional:  Alert and oriented, No acute distress Cardiovascular: Regular rate and rhythm, No JVD Respiratory: Normal respiratory effort, Lungs clear bilaterally GI: Abdomen is soft, nontender, nondistended, no abdominal masses GU: No CVA tenderness Lymphatic: No lymphadenopathy Neurologic: Grossly intact, no focal deficits Psychiatric: Normal mood and affect  Laboratory Data:  No results for input(s): WBC, HGB, HCT, PLT in the last 72 hours.  No results for input(s): NA, K, CL, GLUCOSE, BUN, CALCIUM, CREATININE in the last 72  hours.  Invalid input(s): CO3   No results found for this or any previous visit (from the past 24 hours). No results found for this or any previous visit (from the past 240 hours).  Renal Function: No results for input(s): CREATININE in the last 168 hours. CrCl cannot be calculated (Unknown ideal weight.).  Radiologic Imaging: No results found.  I independently reviewed the above imaging studies.  Assessment and Plan Thomas Sharp is a 78 y.o. male with grade 5 prostate cancer   -The patient was counseled about the natural history of prostate cancer and the standard treatment options that are available for prostate cancer. It was explained to him how his age and life expectancy, clinical stage, Gleason score, and PSA affect his prognosis, the decision to proceed with additional staging studies, as well as how that information influences recommended treatment strategies. We discussed the roles for active surveillance, radiation therapy, surgical therapy, androgen deprivation, as well as ablative therapy options for the treatment of prostate cancer as appropriate to his individual cancer situation. We discussed the risks and benefits of these options with regard to their impact on cancer control and also in terms of potential adverse events, complications, and impact on quality of life particularly related to urinary and sexual function. The patient was encouraged to ask questions throughout the discussion today and all questions were answered to his stated satisfaction. In addition, the patient was provided with and/or directed to appropriate resources and literature for further education about  prostate cancer and treatment options.   The patient has decided to proceed with cystoscopy, brachytherapy seed and SpaceOAR placement as primary treatment of his risk prostate cancer.  The risks, benefits and alternatives of the aforementioned procedures was discussed in detail.  Risks include, bur  are not limited to worsening LUTS, erectile dysfunction, rectal irritation, urethral stricture formation, fistula formation, cancer recurrence, MI, CVA, PE, DVT and the inherent risk of general anesthesia.  He voices understanding and wishes to proceed.      Yevonne Heman, MD 05/20/2024, 4:50 PM  [facility] Pager: [phone]

## 2024-05-20 NOTE — Progress Notes (Signed)
  Radiation Oncology         [phone] ________________________________  Name: Yaasir Menken MRN: [id number]  Date: 05/21/2024  DOB: 1946/10/04       Prostate Seed Implant  WG:[id number], [name], MD  No ref. provider found  DIAGNOSIS:   78 y.o. gentleman with Stage T1c adenocarcinoma of the prostate with Gleason score of 4+5, and PSA of 16.   Oncology History  Prostate cancer (HCC)  01/29/2024 Cancer Staging   Staging form: Prostate, AJCC 8th Edition - Clinical stage from 01/29/2024: Stage IIIC (cT1c, cN0, cM0, PSA: 16, Grade Group: 5) - Signed by Keitha Pata, PA-C on 02/28/2024 Histopathologic type: Adenocarcinoma, NOS Stage prefix: Initial diagnosis Prostate specific antigen (PSA) range: 10 to 19 Gleason primary pattern: 4 Gleason secondary pattern: 5 Gleason score: 9 Histologic grading system: 5 grade system Number of biopsy cores examined: 23 Number of biopsy cores positive: 15 Location of positive needle core biopsies: Both sides   02/28/2024 Initial Diagnosis   Prostate cancer (HCC)     No diagnosis found.  PROCEDURE: Insertion of radioactive I-125 seeds into the prostate gland.  RADIATION DOSE: 110 Gy, boost therapy.  TECHNIQUE: Dashiel Bergquist was brought to the operating room with the urologist. He was placed in the dorsolithotomy position. He was catheterized and a rectal tube was inserted. The perineum was shaved, prepped and draped. The ultrasound probe was then introduced by me into the rectum to see the prostate gland.  TREATMENT DEVICE: I attached the needle grid to the ultrasound probe stand and anchor needles were placed.  3D PLANNING: The prostate was imaged in 3D using a sagittal sweep of the prostate probe. These images were transferred to the planning computer. There, the prostate, urethra and rectum were defined on each axial reconstructed image. Then, the software created an optimized 3D plan and a few seed positions were adjusted. The quality of the plan  was reviewed using [facility] information for the target and the following two organs at risk:  Urethra and Rectum.  Then the accepted plan was printed and handed off to the radiation therapist.  Under my supervision, the custom loading of the seeds and spacers was carried out using the quick loader.  These pre-loaded needles were then placed into the needle holder.Aaron Aas  PROSTATE VOLUME STUDY:  Using transrectal ultrasound the volume of the prostate was verified to be 33.8 cc.  SPECIAL TREATMENT PROCEDURE/SUPERVISION AND HANDLING: The pre-loaded needles were then delivered by the urologist under sagittal guidance. A total of 17 needles were used to deposit 60 seeds in the prostate gland. The individual seed activity was 0.320 mCi.  SpaceOAR:  Yes  COMPLEX SIMULATION: At the end of the procedure, an anterior radiograph of the pelvis was obtained to document seed positioning and count. Cystoscopy was performed by the urologist to check the urethra and bladder.  MICRODOSIMETRY: At the end of the procedure, the patient was emitting 0.067 mR/hr at 1 meter. Accordingly, he was considered safe for hospital discharge.  PLAN: The patient will return to the radiation oncology clinic for post implant CT dosimetry in three weeks.   ________________________________  Trilby Fujisawa Lorri Rota, M.D.

## 2024-05-20 NOTE — Telephone Encounter (Signed)
 Called patient to remind of procedure for 05-21-24, spoke with patient and he is aware of this procedure

## 2024-05-21 ENCOUNTER — Ambulatory Visit (HOSPITAL_COMMUNITY)

## 2024-05-21 ENCOUNTER — Other Ambulatory Visit: Payer: Self-pay | Admitting: Urology

## 2024-05-21 ENCOUNTER — Encounter (HOSPITAL_COMMUNITY)
Admission: RE | Disposition: A | Discharge status: Home / Self Care | Payer: Self-pay | Source: Ambulatory Visit | Attending: Urology

## 2024-05-21 ENCOUNTER — Ambulatory Visit (HOSPITAL_COMMUNITY): Admitting: Certified Registered Nurse Anesthetist

## 2024-05-21 ENCOUNTER — Encounter (HOSPITAL_COMMUNITY): Payer: Self-pay | Admitting: Urology

## 2024-05-21 ENCOUNTER — Ambulatory Visit (HOSPITAL_COMMUNITY)
Admission: RE | Admit: 2024-05-21 | Discharge: 2024-05-21 | Disposition: A | Discharge status: Home / Self Care | Source: Ambulatory Visit | Attending: Urology | Admitting: Urology

## 2024-05-21 DIAGNOSIS — C61 Malignant neoplasm of prostate: Secondary | ICD-10-CM | POA: Insufficient documentation

## 2024-05-21 DIAGNOSIS — F17210 Nicotine dependence, cigarettes, uncomplicated: Secondary | ICD-10-CM | POA: Insufficient documentation

## 2024-05-21 DIAGNOSIS — G250 Essential tremor: Secondary | ICD-10-CM | POA: Diagnosis not present

## 2024-05-21 HISTORY — PX: RADIOACTIVE SEED IMPLANT: SHX5150

## 2024-05-21 SURGERY — INSERTION, RADIATION SOURCE, PROSTATE
Anesthesia: General | Site: Prostate

## 2024-05-21 MED ORDER — ONDANSETRON HCL 4 MG/2ML IJ SOLN
INTRAMUSCULAR | Status: DC | PRN
  Administered 2024-05-21: 4 mg via INTRAVENOUS

## 2024-05-21 MED ORDER — AMISULPRIDE (ANTIEMETIC) 5 MG/2ML IV SOLN: 10.0000 mg | Freq: Once | INTRAVENOUS | Status: DC | PRN

## 2024-05-21 MED ORDER — FENTANYL CITRATE (PF) 100 MCG/2ML IJ SOLN
INTRAMUSCULAR | Status: AC
  Filled 2024-05-21: qty 2

## 2024-05-21 MED ORDER — FLEET ENEMA RE ENEM
1.0000 | ENEMA | Freq: Once | RECTAL | Status: DC
  Filled 2024-05-21: qty 1

## 2024-05-21 MED ORDER — PROPOFOL 10 MG/ML IV BOLUS
INTRAVENOUS | Status: AC
  Filled 2024-05-21: qty 20

## 2024-05-21 MED ORDER — ORAL CARE MOUTH RINSE: 15.0000 mL | Freq: Once | OROMUCOSAL | Status: AC

## 2024-05-21 MED ORDER — LIDOCAINE HCL (PF) 2 % IJ SOLN
INTRAMUSCULAR | Status: DC | PRN
  Administered 2024-05-21: 60 mg via INTRADERMAL

## 2024-05-21 MED ORDER — STERILE WATER FOR IRRIGATION IR SOLN
Status: DC | PRN
  Administered 2024-05-21: 1000 mL

## 2024-05-21 MED ORDER — PROPOFOL 10 MG/ML IV BOLUS
INTRAVENOUS | Status: DC | PRN
Start: 2024-05-21 — End: 2024-05-21
  Administered 2024-05-21: 100 mg via INTRAVENOUS
  Administered 2024-05-21: 20 mg via INTRAVENOUS

## 2024-05-21 MED ORDER — ACETAMINOPHEN 500 MG PO TABS
1000.0000 mg | ORAL_TABLET | Freq: Once | ORAL | Status: AC
Start: 2024-05-21 — End: 2024-05-21
  Administered 2024-05-21: 1000 mg via ORAL
  Filled 2024-05-21: qty 2

## 2024-05-21 MED ORDER — CIPROFLOXACIN IN D5W 400 MG/200ML IV SOLN
400.0000 mg | INTRAVENOUS | Status: AC
  Administered 2024-05-21: 400 mg via INTRAVENOUS
  Filled 2024-05-21: qty 200

## 2024-05-21 MED ORDER — IOHEXOL 300 MG/ML  SOLN
INTRAMUSCULAR | Status: DC | PRN
  Administered 2024-05-21: 5 mL

## 2024-05-21 MED ORDER — DEXAMETHASONE SODIUM PHOSPHATE 10 MG/ML IJ SOLN
INTRAMUSCULAR | Status: DC | PRN
  Administered 2024-05-21: 5 mg via INTRAVENOUS

## 2024-05-21 MED ORDER — FENTANYL CITRATE (PF) 250 MCG/5ML IJ SOLN
INTRAMUSCULAR | Status: DC | PRN
  Administered 2024-05-21 (×2): 50 ug via INTRAVENOUS

## 2024-05-21 MED ORDER — ROCURONIUM BROMIDE 10 MG/ML (PF) SYRINGE
PREFILLED_SYRINGE | INTRAVENOUS | Status: DC | PRN
  Administered 2024-05-21: 10 mg via INTRAVENOUS
  Administered 2024-05-21: 20 mg via INTRAVENOUS
  Administered 2024-05-21: 40 mg via INTRAVENOUS

## 2024-05-21 MED ORDER — SUGAMMADEX SODIUM 200 MG/2ML IV SOLN
INTRAVENOUS | Status: DC | PRN
  Administered 2024-05-21: 200 mg via INTRAVENOUS

## 2024-05-21 MED ORDER — PHENYLEPHRINE 80 MCG/ML (10ML) SYRINGE FOR IV PUSH (FOR BLOOD PRESSURE SUPPORT)
PREFILLED_SYRINGE | INTRAVENOUS | Status: DC | PRN
  Administered 2024-05-21 (×2): 80 ug via INTRAVENOUS

## 2024-05-21 MED ORDER — ONDANSETRON HCL 4 MG/2ML IJ SOLN
4.0000 mg | Freq: Once | INTRAMUSCULAR | Status: DC | PRN
Start: 2024-05-21 — End: 2024-05-21

## 2024-05-21 MED ORDER — SODIUM CHLORIDE (PF) 0.9 % IJ SOLN
INTRAMUSCULAR | Status: DC | PRN
  Administered 2024-05-21: 10 mL

## 2024-05-21 MED ORDER — SODIUM CHLORIDE (PF) 0.9 % IJ SOLN
INTRAMUSCULAR | Status: AC
  Filled 2024-05-21: qty 10

## 2024-05-21 MED ORDER — CHLORHEXIDINE GLUCONATE 0.12 % MT SOLN
15.0000 mL | Freq: Once | OROMUCOSAL | Status: AC
  Administered 2024-05-21: 15 mL via OROMUCOSAL

## 2024-05-21 MED ORDER — FENTANYL CITRATE PF 50 MCG/ML IJ SOSY: 25.0000 ug | PREFILLED_SYRINGE | INTRAMUSCULAR | Status: DC | PRN

## 2024-05-21 MED ORDER — LACTATED RINGERS IV SOLN: INTRAVENOUS | Status: DC

## 2024-05-21 MED ORDER — PHENYLEPHRINE HCL-NACL 20-0.9 MG/250ML-% IV SOLN
INTRAVENOUS | Status: DC | PRN
  Administered 2024-05-21: 25 ug/min via INTRAVENOUS

## 2024-05-21 SURGICAL SUPPLY — 30 items
BAG URINE DRAIN 2000ML AR STRL (UROLOGICAL SUPPLIES) ×1 IMPLANT
BARD QUICKLINK CARTRIDGES WITH BRACHYSOURCE I-125 IMPLANT
CATH FOLEY 2WAY SLVR 5CC 16FR (CATHETERS) IMPLANT
CATH ROBINSON RED A/P 20FR (CATHETERS) ×1 IMPLANT
COVER BACK TABLE 60X90IN (DRAPES) ×1 IMPLANT
COVER MAYO STAND STRL (DRAPES) ×1 IMPLANT
COVER SURGICAL LIGHT HANDLE (MISCELLANEOUS) ×1 IMPLANT
DRSG TEGADERM 4X4.75 (GAUZE/BANDAGES/DRESSINGS) ×1 IMPLANT
DRSG TEGADERM 8X12 (GAUZE/BANDAGES/DRESSINGS) ×1 IMPLANT
GLOVE SURG LX STRL 8.0 MICRO (GLOVE) ×2 IMPLANT
GLOVE SURG ORTHO 8.5 STRL (GLOVE) ×4 IMPLANT
GOWN STRL REUS W/ TWL XL LVL3 (GOWN DISPOSABLE) ×2 IMPLANT
GRID BRACH TEMP 18GA 2.8X3X.75 (MISCELLANEOUS) ×1 IMPLANT
HOLDER FOLEY CATH W/STRAP (MISCELLANEOUS) ×1 IMPLANT
IMPL SPACEOAR SYSTEM 10ML (Spacer) ×1 IMPLANT
KIT TURNOVER KIT A (KITS) ×2 IMPLANT
MARKER SKIN DUAL TIP RULER LAB (MISCELLANEOUS) ×1 IMPLANT
NDL BRACHY 18G 5PK (NEEDLE) ×4 IMPLANT
NDL BRACHY 18G SINGLE (NEEDLE) IMPLANT
NDL PK MORGANSTERN STABILIZ (NEEDLE) ×1 IMPLANT
NEEDLE BRACHY 18G 5PK (NEEDLE) ×4 IMPLANT
NEEDLE BRACHY 18G SINGLE (NEEDLE) IMPLANT
NEEDLE PK MORGANSTERN STABILIZ (NEEDLE) ×1 IMPLANT
PACK CYSTO (CUSTOM PROCEDURE TRAY) ×1 IMPLANT
SHEATH ULTRASOUND LF (SHEATH) IMPLANT
SHEATH ULTRASOUND LTX NONSTRL (SHEATH) IMPLANT
SYR 10ML LL (SYRINGE) ×1 IMPLANT
TOWEL OR 17X26 10 PK STRL BLUE (TOWEL DISPOSABLE) ×1 IMPLANT
TRAY FOLEY MTR SLVR 16FR STAT (SET/KITS/TRAYS/PACK) ×1 IMPLANT
UNDERPAD 30X36 HEAVY ABSORB (UNDERPADS AND DIAPERS) ×2 IMPLANT

## 2024-05-21 NOTE — Transfer of Care (Signed)
 Immediate Anesthesia Transfer of Care Note  Patient: Thomas Sharp  Procedure(s) Performed: INSERTION, RADIATION SOURCE, PROSTATE (Prostate)  Patient Location: PACU  Anesthesia Type:General  Level of Consciousness: awake  Airway & Oxygen Therapy: Patient Spontanous Breathing  Post-op Assessment: Report given to RN and Post -op Vital signs reviewed and stable  Post vital signs: Reviewed and stable  Last Vitals:  Vitals Value Taken Time  BP 150/67 05/21/24 09:24  Temp    Pulse 67 05/21/24 09:27  Resp 17 05/21/24 09:27  SpO2 100 % 05/21/24 09:27  Vitals shown include unfiled device data.  Last Pain:  Vitals:   05/21/24 0541  TempSrc:   PainSc: 0-No pain         Complications: No notable events documented.

## 2024-05-21 NOTE — Anesthesia Procedure Notes (Signed)
 Procedure Name: Intubation Date/Time: 05/21/2024 7:51 AM  Performed by: Virgil Griffiths, CRNAPre-anesthesia Checklist: Patient identified, Emergency Drugs available, Suction available and Patient being monitored Patient Re-evaluated:Patient Re-evaluated prior to induction Oxygen Delivery Method: Circle System Utilized Preoxygenation: Pre-oxygenation with 100% oxygen Induction Type: IV induction Ventilation: Mask ventilation without difficulty Laryngoscope Size: Mac and 4 Grade View: Grade I Tube type: Oral Tube size: 7.5 mm Number of attempts: 1 Airway Equipment and Method: Stylet and Oral airway Placement Confirmation: ETT inserted through vocal cords under direct vision, positive ETCO2 and breath sounds checked- equal and bilateral Secured at: 23 cm Tube secured with: Tape Dental Injury: Teeth and Oropharynx as per pre-operative assessment

## 2024-05-21 NOTE — Op Note (Signed)
 PATIENT:  Thomas Sharp  PRE-OPERATIVE DIAGNOSIS:  Adenocarcinoma of the prostate  POST-OPERATIVE DIAGNOSIS:  Same  PROCEDURE:  1. I-125 radioactive seed implantation 2. Cystoscopy  3. Placement of SpaceOAR  SURGEON:  Yevonne Heman, MD  Radiation oncologist: Kenith Payer, MD  ANESTHESIA:  General  EBL:  Minimal  DRAINS: None  INDICATION: Greg Cratty is a 78 year old male with grade 5 prostate cancer.  He is here today for brachytherapy seed and SpaceOAR placement as primary treatment of his prostate cancer. He has been consented for the above procedures, voices understanding and wishes to proceed.   Description of procedure: After informed consent the patient was brought to the major OR, placed on the table and administered general anesthesia. He was then moved to the modified lithotomy position with his perineum perpendicular to the floor. His perineum and genitalia were then sterilely prepped. An official timeout was then performed. A 16 French Foley catheter was then placed in the bladder and filled with dilute contrast, a rectal tube was placed in the rectum and the transrectal ultrasound probe was placed in the rectum and affixed to the stand. He was then sterilely draped.  Real time ultrasonography was used along with the seed planning software. This was used to develop the seed plan including the number of needles as well as number of seeds required for complete and adequate coverage. Real-time ultrasonography was then used along with the previously developed plan and the Nucletron device to implant a total of 60 seeds using 17 needles. This proceeded without difficulty or complication.   I then proceeded with placement of SpaceOAR by introducing a needle with the bevel angled inferiorly approximately 2 cm superior to the anus. This was angled downward and under direct ultrasound was placed within the space between the prostatic capsule and rectum. This was confirmed with  a small amount of sterile saline injected and this was performed under direct ultrasound. I then attached the SpaceOAR to the needle and injected this in the space between the prostate and rectum with good placement noted.  A Foley catheter was then removed as well as the transrectal ultrasound probe and rectal probe. Flexible cystoscopy was then performed using the 16 French flexible scope which revealed a normal urethra throughout its length down to the sphincter which appeared intact. The prostatic urethra revealed bilobar hypertrophy but no evidence of obstruction, seeds, spacers or lesions. The bladder was then entered and fully and systematically inspected. The ureteral orifices were noted to be of normal configuration and position. The mucosa revealed no evidence of tumors. There were also no stones identified within the bladder. I noted no seeds or spacers on the floor of the bladder and retroflexion of the scope revealed no seeds protruding from the base of the prostate.  The cystoscope was then removed and the patient was awakened and taken to recovery room in stable and satisfactory condition. He tolerated procedure well and there were no intraoperative complications.   Plan: Discharge home

## 2024-05-22 ENCOUNTER — Encounter (HOSPITAL_COMMUNITY): Payer: Self-pay | Admitting: Urology

## 2024-05-22 NOTE — Anesthesia Postprocedure Evaluation (Signed)
 Anesthesia Post Note  Patient: Thomas Sharp  Procedure(s) Performed: INSERTION, RADIATION SOURCE, PROSTATE (Prostate)     Patient location during evaluation: PACU Anesthesia Type: General Level of consciousness: awake Pain management: pain level controlled Vital Signs Assessment: post-procedure vital signs reviewed and stable Respiratory status: spontaneous breathing, nonlabored ventilation and respiratory function stable Cardiovascular status: blood pressure returned to baseline and stable Postop Assessment: no apparent nausea or vomiting Anesthetic complications: no   No notable events documented.  Last Vitals:  Vitals:   05/21/24 1000 05/21/24 1015  BP: (!) 147/60 (!) 144/63  Pulse: (!) 56 (!) 56  Resp: 12 14  Temp: (!) 35.9 C 36.4 C  SpO2: 99% 100%    Last Pain:  Vitals:   05/21/24 1015  TempSrc:   PainSc: 0-No pain                 Falynn Ailey P Malayah Demuro

## 2024-05-24 ENCOUNTER — Telehealth: Payer: Self-pay | Admitting: *Deleted

## 2024-05-24 NOTE — Telephone Encounter (Signed)
 Returned patient's wife's phone call Andee Bamberger), spoke with Andee Bamberger.

## 2024-05-27 NOTE — Progress Notes (Signed)
 Patient proceed with brachytherapy on 6/17.  Patient is scheduled for CT Sim on 7/3 and will have 5 weeks of daily radiation to follow.  Urology post op is scheduled for 7/8.

## 2024-06-04 ENCOUNTER — Telehealth: Payer: Self-pay | Admitting: *Deleted

## 2024-06-04 NOTE — Telephone Encounter (Signed)
 Called patient to remind of sim and MRI for 06-06-24, spoke with patient's wife and they are aware of these appts.

## 2024-06-06 ENCOUNTER — Ambulatory Visit (HOSPITAL_COMMUNITY)
Admission: RE | Admit: 2024-06-06 | Discharge: 2024-06-06 | Disposition: A | Discharge status: Home / Self Care | Source: Ambulatory Visit | Attending: Urology | Admitting: Urology

## 2024-06-06 ENCOUNTER — Ambulatory Visit
Admission: RE | Admit: 2024-06-06 | Discharge: 2024-06-06 | Disposition: A | Discharge status: Home / Self Care | Source: Ambulatory Visit | Attending: Urology | Admitting: Urology

## 2024-06-06 DIAGNOSIS — Z510 Encounter for antineoplastic radiation therapy: Secondary | ICD-10-CM | POA: Diagnosis not present

## 2024-06-06 DIAGNOSIS — C61 Malignant neoplasm of prostate: Secondary | ICD-10-CM | POA: Insufficient documentation

## 2024-06-06 NOTE — Progress Notes (Signed)
  Radiation Oncology         [phone]) [phone] ________________________________  Name: Thomas Sharp MRN: [id number]  Date: 06/06/2024  DOB: Jan 20, 1946  COMPLEX SIMULATION NOTE  NARRATIVE:  The patient was brought to the CT Simulation planning suite today following prostate seed implantation approximately one month ago.  Identity was confirmed.  All relevant records and images related to the planned course of therapy were reviewed.  Then, the patient was set-up supine.  CT images were obtained.  The CT images were loaded into the planning software.  Then the prostate and rectum were contoured.  Treatment planning then occurred.  The implanted iodine 125 seeds were identified by the physics staff for projection of radiation distribution  I have requested : 3D Simulation  I have requested a DVH of the following structures: Prostate and rectum.    ________________________________  [name], M.D.

## 2024-06-06 NOTE — Progress Notes (Signed)
  Radiation Oncology         [phone]) [phone] ________________________________  Name: Thomas Sharp MRN: [id number]  Date: 06/06/2024  DOB: September 03, 1946  SIMULATION AND TREATMENT PLANNING NOTE    ICD-10-CM   1. Prostate cancer (HCC)  C61       DIAGNOSIS:  78 y.o. gentleman with Stage T1c adenocarcinoma of the prostate with Gleason score of 4+5, and PSA of 16.   NARRATIVE:  The patient was brought to the CT Simulation planning suite.  Identity was confirmed.  All relevant records and images related to the planned course of therapy were reviewed.  The patient freely provided informed written consent to proceed with treatment after reviewing the details related to the planned course of therapy. The consent form was witnessed and verified by the simulation staff.  Then, the patient was set-up in a stable reproducible supine position for radiation therapy.  A vacuum lock pillow device was custom fabricated to position his legs in a reproducible immobilized position.  Then, I performed a urethrogram under sterile conditions to identify the prostatic apex.  CT images were obtained.  Surface markings were placed.  The CT images were loaded into the planning software.  Then the prostate target and avoidance structures including the rectum, bladder, bowel and hips were contoured.  Treatment planning then occurred.  The radiation prescription was entered and confirmed.  A total of one complex treatment devices were fabricated. I have requested : Intensity Modulated Radiotherapy (IMRT) is medically necessary for this case for the following reason:  Rectal sparing.SABRA  PLAN:  The patient will receive 45 Gy in 25 fractions of 1.8 Gy, to supplement an up-front prostate seed implant boost of 110 Gy to achieve a total nominal dose of 155 Gy.  ________________________________  [name], M.D.

## 2024-06-10 ENCOUNTER — Encounter: Payer: Self-pay | Admitting: Radiation Oncology

## 2024-06-10 DIAGNOSIS — Z510 Encounter for antineoplastic radiation therapy: Secondary | ICD-10-CM | POA: Diagnosis not present

## 2024-06-10 NOTE — Progress Notes (Signed)
  Radiation Oncology         [phone] ________________________________  Name: Thomas Sharp MRN: [id number]  Date: 06/10/2024  DOB: [birth year]  3D Planning Note   Prostate Brachytherapy Post-Implant Dosimetry  Diagnosis: 78 y.o. gentleman with Stage T1c adenocarcinoma of the prostate with Gleason score of 4+5, and PSA of 16.   Narrative: On a previous date, Thomas Sharp returned following prostate seed implantation for post implant planning. He underwent CT scan complex simulation to delineate the three-dimensional structures of the pelvis and demonstrate the radiation distribution.  Since that time, the seed localization, and complex isodose planning with dose volume histograms have now been completed.  Results:   Prostate Coverage - The dose of radiation delivered to the 90% or more of the prostate gland (D90) was 98.65% of the prescription dose. This exceeds our goal of greater than 90%. Rectal Sparing - The volume of rectal tissue receiving the prescription dose or higher was 0.0 cc. This falls under our thresholds tolerance of 1.0 cc.  Impression: The prostate seed implant appears to show adequate target coverage and appropriate rectal sparing.  Plan:  The patient will continue to follow with urology for ongoing PSA determinations. I would anticipate a high likelihood for local tumor control with minimal risk for rectal morbidity.  ________________________________  [name], M.D.

## 2024-06-18 DIAGNOSIS — C61 Malignant neoplasm of prostate: Secondary | ICD-10-CM | POA: Diagnosis present

## 2024-06-18 DIAGNOSIS — Z510 Encounter for antineoplastic radiation therapy: Secondary | ICD-10-CM | POA: Diagnosis not present

## 2024-06-19 ENCOUNTER — Ambulatory Visit
Admission: RE | Admit: 2024-06-19 | Discharge: 2024-06-19 | Disposition: A | Discharge status: Home / Self Care | Source: Ambulatory Visit | Attending: Radiation Oncology | Admitting: Radiation Oncology

## 2024-06-19 ENCOUNTER — Other Ambulatory Visit: Payer: Self-pay

## 2024-06-19 DIAGNOSIS — Z510 Encounter for antineoplastic radiation therapy: Secondary | ICD-10-CM | POA: Diagnosis not present

## 2024-06-19 LAB — RAD ONC ARIA SESSION SUMMARY
Course Elapsed Days: 0
Plan Fractions Treated to Date: 1
Plan Prescribed Dose Per Fraction: 1.8 Gy
Plan Total Fractions Prescribed: 25
Plan Total Prescribed Dose: 45 Gy
Reference Point Dosage Given to Date: 1.8 Gy
Reference Point Session Dosage Given: 1.8 Gy
Session Number: 1

## 2024-06-19 NOTE — Radiation Completion Notes (Signed)
 Patient Name: Thomas Sharp, Thomas Sharp MRN: [id number] Date of Birth: 12-13-1945 Referring Physician: Lonni Han, M.D. Date of Service: 2024-06-19 Radiation Oncologist: Adina Barge, M.D. [facility]                             RADIATION ONCOLOGY END OF TREATMENT NOTE     Diagnosis: C61 Malignant neoplasm of prostate Staging on 2024-01-29: Prostate cancer (HCC) T=cT1c, N=cN0, M=cM0 Intent: Curative     ==========DELIVERED PLANS==========  First Treatment Date: 2024-06-17 Last Treatment Date: 2024-06-19   Plan Name: Prostate_Pelv Site: Prostate Technique: IMRT Mode: Photon Dose Per Fraction: 1.8 Gy Prescribed Dose (Delivered / Prescribed): 1.8 Gy / 45 Gy Prescribed Fxs (Delivered / Prescribed): 1 / 25   Plan Name: Prostate Seed Implant Site: Prostate Technique: Radioactive Seed Implant I-125 Mode: Brachytherapy Dose Per Fraction: 110 Gy Prescribed Dose (Delivered / Prescribed): 110 Gy / 110 Gy Prescribed Fxs (Delivered / Prescribed): 1 / 1     ==========ON TREATMENT VISIT DATES==========      ==========UPCOMING VISITS==========       ==========APPENDIX - ON TREATMENT VISIT NOTES==========   See weekly On Treatment Notes in Epic for details in the Media tab (listed as Progress notes on the On Treatment Visit Dates listed above).

## 2024-06-20 ENCOUNTER — Other Ambulatory Visit: Payer: Self-pay

## 2024-06-20 ENCOUNTER — Ambulatory Visit
Admission: RE | Admit: 2024-06-20 | Discharge: 2024-06-20 | Disposition: A | Discharge status: Home / Self Care | Source: Ambulatory Visit | Attending: Radiation Oncology

## 2024-06-20 DIAGNOSIS — Z510 Encounter for antineoplastic radiation therapy: Secondary | ICD-10-CM | POA: Diagnosis not present

## 2024-06-20 LAB — RAD ONC ARIA SESSION SUMMARY
Course Elapsed Days: 1
Plan Fractions Treated to Date: 2
Plan Prescribed Dose Per Fraction: 1.8 Gy
Plan Total Fractions Prescribed: 25
Plan Total Prescribed Dose: 45 Gy
Reference Point Dosage Given to Date: 3.6 Gy
Reference Point Session Dosage Given: 1.8 Gy
Session Number: 2

## 2024-06-21 ENCOUNTER — Ambulatory Visit
Admission: RE | Admit: 2024-06-21 | Discharge: 2024-06-21 | Disposition: A | Discharge status: Home / Self Care | Source: Ambulatory Visit | Attending: Radiation Oncology | Admitting: Radiation Oncology

## 2024-06-21 ENCOUNTER — Other Ambulatory Visit: Payer: Self-pay

## 2024-06-21 DIAGNOSIS — Z510 Encounter for antineoplastic radiation therapy: Secondary | ICD-10-CM | POA: Diagnosis not present

## 2024-06-21 LAB — RAD ONC ARIA SESSION SUMMARY
Course Elapsed Days: 2
Plan Fractions Treated to Date: 3
Plan Prescribed Dose Per Fraction: 1.8 Gy
Plan Total Fractions Prescribed: 25
Plan Total Prescribed Dose: 45 Gy
Reference Point Dosage Given to Date: 5.4 Gy
Reference Point Session Dosage Given: 1.8 Gy
Session Number: 3

## 2024-06-24 ENCOUNTER — Ambulatory Visit
Admission: RE | Admit: 2024-06-24 | Discharge: 2024-06-24 | Disposition: A | Discharge status: Home / Self Care | Source: Ambulatory Visit | Attending: Radiation Oncology

## 2024-06-24 ENCOUNTER — Other Ambulatory Visit: Payer: Self-pay

## 2024-06-24 DIAGNOSIS — Z510 Encounter for antineoplastic radiation therapy: Secondary | ICD-10-CM | POA: Diagnosis not present

## 2024-06-24 LAB — RAD ONC ARIA SESSION SUMMARY
Course Elapsed Days: 5
Plan Fractions Treated to Date: 4
Plan Prescribed Dose Per Fraction: 1.8 Gy
Plan Total Fractions Prescribed: 25
Plan Total Prescribed Dose: 45 Gy
Reference Point Dosage Given to Date: 7.2 Gy
Reference Point Session Dosage Given: 1.8 Gy
Session Number: 4

## 2024-06-25 ENCOUNTER — Other Ambulatory Visit: Payer: Self-pay

## 2024-06-25 ENCOUNTER — Ambulatory Visit
Admission: RE | Admit: 2024-06-25 | Discharge: 2024-06-25 | Disposition: A | Discharge status: Home / Self Care | Source: Ambulatory Visit | Attending: Radiation Oncology | Admitting: Radiation Oncology

## 2024-06-25 DIAGNOSIS — Z510 Encounter for antineoplastic radiation therapy: Secondary | ICD-10-CM | POA: Diagnosis not present

## 2024-06-25 LAB — RAD ONC ARIA SESSION SUMMARY
Course Elapsed Days: 6
Plan Fractions Treated to Date: 5
Plan Prescribed Dose Per Fraction: 1.8 Gy
Plan Total Fractions Prescribed: 25
Plan Total Prescribed Dose: 45 Gy
Reference Point Dosage Given to Date: 9 Gy
Reference Point Session Dosage Given: 1.8 Gy
Session Number: 5

## 2024-06-26 ENCOUNTER — Ambulatory Visit

## 2024-06-27 ENCOUNTER — Ambulatory Visit
Admission: RE | Admit: 2024-06-27 | Discharge: 2024-06-27 | Disposition: A | Discharge status: Home / Self Care | Source: Ambulatory Visit | Attending: Radiation Oncology

## 2024-06-27 ENCOUNTER — Other Ambulatory Visit: Payer: Self-pay

## 2024-06-27 DIAGNOSIS — Z510 Encounter for antineoplastic radiation therapy: Secondary | ICD-10-CM | POA: Diagnosis not present

## 2024-06-27 LAB — RAD ONC ARIA SESSION SUMMARY
Course Elapsed Days: 8
Plan Fractions Treated to Date: 6
Plan Prescribed Dose Per Fraction: 1.8 Gy
Plan Total Fractions Prescribed: 25
Plan Total Prescribed Dose: 45 Gy
Reference Point Dosage Given to Date: 10.8 Gy
Reference Point Session Dosage Given: 1.8 Gy
Session Number: 6

## 2024-06-28 ENCOUNTER — Ambulatory Visit
Admission: RE | Admit: 2024-06-28 | Discharge: 2024-06-28 | Disposition: A | Discharge status: Home / Self Care | Source: Ambulatory Visit | Attending: Radiation Oncology | Admitting: Radiation Oncology

## 2024-06-28 ENCOUNTER — Other Ambulatory Visit: Payer: Self-pay

## 2024-06-28 DIAGNOSIS — Z510 Encounter for antineoplastic radiation therapy: Secondary | ICD-10-CM | POA: Diagnosis not present

## 2024-06-28 LAB — RAD ONC ARIA SESSION SUMMARY
Course Elapsed Days: 9
Plan Fractions Treated to Date: 7
Plan Prescribed Dose Per Fraction: 1.8 Gy
Plan Total Fractions Prescribed: 25
Plan Total Prescribed Dose: 45 Gy
Reference Point Dosage Given to Date: 12.6 Gy
Reference Point Session Dosage Given: 1.8 Gy
Session Number: 7

## 2024-07-01 ENCOUNTER — Other Ambulatory Visit: Payer: Self-pay

## 2024-07-01 ENCOUNTER — Ambulatory Visit
Admission: RE | Admit: 2024-07-01 | Discharge: 2024-07-01 | Disposition: A | Discharge status: Home / Self Care | Source: Ambulatory Visit | Attending: Radiation Oncology | Admitting: Radiation Oncology

## 2024-07-01 DIAGNOSIS — Z510 Encounter for antineoplastic radiation therapy: Secondary | ICD-10-CM | POA: Diagnosis not present

## 2024-07-01 LAB — RAD ONC ARIA SESSION SUMMARY
Course Elapsed Days: 12
Plan Fractions Treated to Date: 8
Plan Prescribed Dose Per Fraction: 1.8 Gy
Plan Total Fractions Prescribed: 25
Plan Total Prescribed Dose: 45 Gy
Reference Point Dosage Given to Date: 14.4 Gy
Reference Point Session Dosage Given: 1.8 Gy
Session Number: 8

## 2024-07-02 ENCOUNTER — Other Ambulatory Visit: Payer: Self-pay

## 2024-07-02 ENCOUNTER — Ambulatory Visit
Admission: RE | Admit: 2024-07-02 | Discharge: 2024-07-02 | Disposition: A | Discharge status: Home / Self Care | Source: Ambulatory Visit | Attending: Radiation Oncology | Admitting: Radiation Oncology

## 2024-07-02 DIAGNOSIS — Z510 Encounter for antineoplastic radiation therapy: Secondary | ICD-10-CM | POA: Diagnosis not present

## 2024-07-02 LAB — RAD ONC ARIA SESSION SUMMARY
Course Elapsed Days: 13
Plan Fractions Treated to Date: 9
Plan Prescribed Dose Per Fraction: 1.8 Gy
Plan Total Fractions Prescribed: 25
Plan Total Prescribed Dose: 45 Gy
Reference Point Dosage Given to Date: 16.2 Gy
Reference Point Session Dosage Given: 1.8 Gy
Session Number: 9

## 2024-07-03 ENCOUNTER — Other Ambulatory Visit: Payer: Self-pay

## 2024-07-03 ENCOUNTER — Ambulatory Visit
Admission: RE | Admit: 2024-07-03 | Discharge: 2024-07-03 | Disposition: A | Discharge status: Home / Self Care | Source: Ambulatory Visit | Attending: Radiation Oncology

## 2024-07-03 DIAGNOSIS — Z510 Encounter for antineoplastic radiation therapy: Secondary | ICD-10-CM | POA: Diagnosis not present

## 2024-07-03 LAB — RAD ONC ARIA SESSION SUMMARY
Course Elapsed Days: 14
Plan Fractions Treated to Date: 10
Plan Prescribed Dose Per Fraction: 1.8 Gy
Plan Total Fractions Prescribed: 25
Plan Total Prescribed Dose: 45 Gy
Reference Point Dosage Given to Date: 18 Gy
Reference Point Session Dosage Given: 1.8 Gy
Session Number: 10

## 2024-07-04 ENCOUNTER — Ambulatory Visit
Admission: RE | Admit: 2024-07-04 | Discharge: 2024-07-04 | Disposition: A | Discharge status: Home / Self Care | Source: Ambulatory Visit | Attending: Radiation Oncology | Admitting: Radiation Oncology

## 2024-07-04 ENCOUNTER — Other Ambulatory Visit: Payer: Self-pay

## 2024-07-04 DIAGNOSIS — Z510 Encounter for antineoplastic radiation therapy: Secondary | ICD-10-CM | POA: Diagnosis not present

## 2024-07-04 LAB — RAD ONC ARIA SESSION SUMMARY
Course Elapsed Days: 15
Plan Fractions Treated to Date: 11
Plan Prescribed Dose Per Fraction: 1.8 Gy
Plan Total Fractions Prescribed: 25
Plan Total Prescribed Dose: 45 Gy
Reference Point Dosage Given to Date: 19.8 Gy
Reference Point Session Dosage Given: 1.8 Gy
Session Number: 11

## 2024-07-05 ENCOUNTER — Ambulatory Visit

## 2024-07-08 ENCOUNTER — Ambulatory Visit: Admission: RE | Admit: 2024-07-08 | Source: Ambulatory Visit

## 2024-07-08 ENCOUNTER — Ambulatory Visit

## 2024-07-09 ENCOUNTER — Other Ambulatory Visit: Payer: Self-pay

## 2024-07-09 ENCOUNTER — Ambulatory Visit
Admission: RE | Admit: 2024-07-09 | Discharge: 2024-07-09 | Discharge status: Home / Self Care | Source: Ambulatory Visit | Attending: Radiation Oncology

## 2024-07-09 ENCOUNTER — Ambulatory Visit
Admission: RE | Admit: 2024-07-09 | Discharge: 2024-07-09 | Disposition: A | Discharge status: Home / Self Care | Source: Ambulatory Visit | Attending: Urology | Admitting: Urology

## 2024-07-09 DIAGNOSIS — Z510 Encounter for antineoplastic radiation therapy: Secondary | ICD-10-CM | POA: Diagnosis not present

## 2024-07-09 DIAGNOSIS — C61 Malignant neoplasm of prostate: Secondary | ICD-10-CM | POA: Diagnosis present

## 2024-07-09 LAB — RAD ONC ARIA SESSION SUMMARY
Course Elapsed Days: 20
Plan Fractions Treated to Date: 12
Plan Prescribed Dose Per Fraction: 1.8 Gy
Plan Total Fractions Prescribed: 25
Plan Total Prescribed Dose: 45 Gy
Reference Point Dosage Given to Date: 21.6 Gy
Reference Point Session Dosage Given: 1.8 Gy
Session Number: 12

## 2024-07-10 ENCOUNTER — Other Ambulatory Visit: Payer: Self-pay

## 2024-07-10 ENCOUNTER — Ambulatory Visit
Admission: RE | Admit: 2024-07-10 | Discharge: 2024-07-10 | Disposition: A | Discharge status: Home / Self Care | Source: Ambulatory Visit | Attending: Radiation Oncology

## 2024-07-10 DIAGNOSIS — Z510 Encounter for antineoplastic radiation therapy: Secondary | ICD-10-CM | POA: Diagnosis not present

## 2024-07-10 LAB — RAD ONC ARIA SESSION SUMMARY
Course Elapsed Days: 21
Plan Fractions Treated to Date: 13
Plan Prescribed Dose Per Fraction: 1.8 Gy
Plan Total Fractions Prescribed: 25
Plan Total Prescribed Dose: 45 Gy
Reference Point Dosage Given to Date: 23.4 Gy
Reference Point Session Dosage Given: 1.8 Gy
Session Number: 13

## 2024-07-11 ENCOUNTER — Ambulatory Visit
Admission: RE | Admit: 2024-07-11 | Discharge: 2024-07-11 | Disposition: A | Discharge status: Home / Self Care | Source: Ambulatory Visit | Attending: Radiation Oncology | Admitting: Radiation Oncology

## 2024-07-11 ENCOUNTER — Other Ambulatory Visit: Payer: Self-pay

## 2024-07-11 DIAGNOSIS — Z510 Encounter for antineoplastic radiation therapy: Secondary | ICD-10-CM | POA: Diagnosis not present

## 2024-07-11 LAB — RAD ONC ARIA SESSION SUMMARY
Course Elapsed Days: 22
Plan Fractions Treated to Date: 14
Plan Prescribed Dose Per Fraction: 1.8 Gy
Plan Total Fractions Prescribed: 25
Plan Total Prescribed Dose: 45 Gy
Reference Point Dosage Given to Date: 25.2 Gy
Reference Point Session Dosage Given: 1.8 Gy
Session Number: 14

## 2024-07-12 ENCOUNTER — Ambulatory Visit
Admission: RE | Admit: 2024-07-12 | Discharge: 2024-07-12 | Disposition: A | Discharge status: Home / Self Care | Source: Ambulatory Visit | Attending: Radiation Oncology | Admitting: Radiation Oncology

## 2024-07-12 ENCOUNTER — Other Ambulatory Visit: Payer: Self-pay

## 2024-07-12 DIAGNOSIS — Z510 Encounter for antineoplastic radiation therapy: Secondary | ICD-10-CM | POA: Diagnosis not present

## 2024-07-12 LAB — RAD ONC ARIA SESSION SUMMARY
Course Elapsed Days: 23
Plan Fractions Treated to Date: 15
Plan Prescribed Dose Per Fraction: 1.8 Gy
Plan Total Fractions Prescribed: 25
Plan Total Prescribed Dose: 45 Gy
Reference Point Dosage Given to Date: 27 Gy
Reference Point Session Dosage Given: 1.8 Gy
Session Number: 15

## 2024-07-15 ENCOUNTER — Ambulatory Visit
Admission: RE | Admit: 2024-07-15 | Discharge: 2024-07-15 | Disposition: A | Discharge status: Home / Self Care | Source: Ambulatory Visit | Attending: Radiation Oncology

## 2024-07-15 ENCOUNTER — Other Ambulatory Visit: Payer: Self-pay

## 2024-07-15 DIAGNOSIS — Z510 Encounter for antineoplastic radiation therapy: Secondary | ICD-10-CM | POA: Diagnosis not present

## 2024-07-15 LAB — RAD ONC ARIA SESSION SUMMARY
Course Elapsed Days: 26
Plan Fractions Treated to Date: 16
Plan Prescribed Dose Per Fraction: 1.8 Gy
Plan Total Fractions Prescribed: 25
Plan Total Prescribed Dose: 45 Gy
Reference Point Dosage Given to Date: 28.8 Gy
Reference Point Session Dosage Given: 1.8 Gy
Session Number: 16

## 2024-07-16 ENCOUNTER — Other Ambulatory Visit: Payer: Self-pay

## 2024-07-16 ENCOUNTER — Ambulatory Visit
Admission: RE | Admit: 2024-07-16 | Discharge: 2024-07-16 | Disposition: A | Discharge status: Home / Self Care | Source: Ambulatory Visit | Attending: Radiation Oncology | Admitting: Radiation Oncology

## 2024-07-16 DIAGNOSIS — Z510 Encounter for antineoplastic radiation therapy: Secondary | ICD-10-CM | POA: Diagnosis not present

## 2024-07-16 LAB — RAD ONC ARIA SESSION SUMMARY
Course Elapsed Days: 27
Plan Fractions Treated to Date: 17
Plan Prescribed Dose Per Fraction: 1.8 Gy
Plan Total Fractions Prescribed: 25
Plan Total Prescribed Dose: 45 Gy
Reference Point Dosage Given to Date: 30.6 Gy
Reference Point Session Dosage Given: 1.8 Gy
Session Number: 17

## 2024-07-17 ENCOUNTER — Other Ambulatory Visit: Payer: Self-pay

## 2024-07-17 ENCOUNTER — Ambulatory Visit
Admission: RE | Admit: 2024-07-17 | Discharge: 2024-07-17 | Disposition: A | Discharge status: Home / Self Care | Source: Ambulatory Visit | Attending: Radiation Oncology | Admitting: Radiation Oncology

## 2024-07-17 DIAGNOSIS — Z510 Encounter for antineoplastic radiation therapy: Secondary | ICD-10-CM | POA: Diagnosis not present

## 2024-07-17 LAB — RAD ONC ARIA SESSION SUMMARY
Course Elapsed Days: 28
Plan Fractions Treated to Date: 18
Plan Prescribed Dose Per Fraction: 1.8 Gy
Plan Total Fractions Prescribed: 25
Plan Total Prescribed Dose: 45 Gy
Reference Point Dosage Given to Date: 32.4 Gy
Reference Point Session Dosage Given: 1.8 Gy
Session Number: 18

## 2024-07-18 ENCOUNTER — Ambulatory Visit
Admission: RE | Admit: 2024-07-18 | Discharge: 2024-07-18 | Disposition: A | Discharge status: Home / Self Care | Source: Ambulatory Visit | Attending: Radiation Oncology | Admitting: Radiation Oncology

## 2024-07-18 ENCOUNTER — Other Ambulatory Visit: Payer: Self-pay

## 2024-07-18 DIAGNOSIS — Z510 Encounter for antineoplastic radiation therapy: Secondary | ICD-10-CM | POA: Diagnosis not present

## 2024-07-18 LAB — RAD ONC ARIA SESSION SUMMARY
Course Elapsed Days: 29
Plan Fractions Treated to Date: 19
Plan Prescribed Dose Per Fraction: 1.8 Gy
Plan Total Fractions Prescribed: 25
Plan Total Prescribed Dose: 45 Gy
Reference Point Dosage Given to Date: 34.2 Gy
Reference Point Session Dosage Given: 1.8 Gy
Session Number: 19

## 2024-07-19 ENCOUNTER — Ambulatory Visit
Admission: RE | Admit: 2024-07-19 | Discharge: 2024-07-19 | Disposition: A | Discharge status: Home / Self Care | Source: Ambulatory Visit | Attending: Radiation Oncology | Admitting: Radiation Oncology

## 2024-07-19 ENCOUNTER — Other Ambulatory Visit: Payer: Self-pay

## 2024-07-19 DIAGNOSIS — Z510 Encounter for antineoplastic radiation therapy: Secondary | ICD-10-CM | POA: Diagnosis not present

## 2024-07-19 LAB — RAD ONC ARIA SESSION SUMMARY
Course Elapsed Days: 30
Plan Fractions Treated to Date: 20
Plan Prescribed Dose Per Fraction: 1.8 Gy
Plan Total Fractions Prescribed: 25
Plan Total Prescribed Dose: 45 Gy
Reference Point Dosage Given to Date: 36 Gy
Reference Point Session Dosage Given: 1.8 Gy
Session Number: 20

## 2024-07-22 ENCOUNTER — Other Ambulatory Visit: Payer: Self-pay

## 2024-07-22 ENCOUNTER — Ambulatory Visit
Admission: RE | Admit: 2024-07-22 | Discharge: 2024-07-22 | Disposition: A | Discharge status: Home / Self Care | Source: Ambulatory Visit | Attending: Radiation Oncology

## 2024-07-22 DIAGNOSIS — Z510 Encounter for antineoplastic radiation therapy: Secondary | ICD-10-CM | POA: Diagnosis not present

## 2024-07-22 LAB — RAD ONC ARIA SESSION SUMMARY
Course Elapsed Days: 33
Plan Fractions Treated to Date: 21
Plan Prescribed Dose Per Fraction: 1.8 Gy
Plan Total Fractions Prescribed: 25
Plan Total Prescribed Dose: 45 Gy
Reference Point Dosage Given to Date: 37.8 Gy
Reference Point Session Dosage Given: 1.8 Gy
Session Number: 21

## 2024-07-23 ENCOUNTER — Other Ambulatory Visit: Payer: Self-pay

## 2024-07-23 ENCOUNTER — Ambulatory Visit
Admission: RE | Admit: 2024-07-23 | Discharge: 2024-07-23 | Disposition: A | Discharge status: Home / Self Care | Source: Ambulatory Visit | Attending: Radiation Oncology | Admitting: Radiation Oncology

## 2024-07-23 ENCOUNTER — Ambulatory Visit

## 2024-07-23 DIAGNOSIS — Z510 Encounter for antineoplastic radiation therapy: Secondary | ICD-10-CM | POA: Diagnosis not present

## 2024-07-23 LAB — RAD ONC ARIA SESSION SUMMARY
Course Elapsed Days: 34
Plan Fractions Treated to Date: 22
Plan Prescribed Dose Per Fraction: 1.8 Gy
Plan Total Fractions Prescribed: 25
Plan Total Prescribed Dose: 45 Gy
Reference Point Dosage Given to Date: 39.6 Gy
Reference Point Session Dosage Given: 1.8 Gy
Session Number: 22

## 2024-07-24 ENCOUNTER — Ambulatory Visit
Admission: RE | Admit: 2024-07-24 | Discharge: 2024-07-24 | Disposition: A | Discharge status: Home / Self Care | Source: Ambulatory Visit | Attending: Radiation Oncology | Admitting: Radiation Oncology

## 2024-07-24 ENCOUNTER — Other Ambulatory Visit: Payer: Self-pay

## 2024-07-24 ENCOUNTER — Ambulatory Visit

## 2024-07-24 DIAGNOSIS — Z510 Encounter for antineoplastic radiation therapy: Secondary | ICD-10-CM | POA: Diagnosis not present

## 2024-07-24 LAB — RAD ONC ARIA SESSION SUMMARY
Course Elapsed Days: 35
Plan Fractions Treated to Date: 23
Plan Prescribed Dose Per Fraction: 1.8 Gy
Plan Total Fractions Prescribed: 25
Plan Total Prescribed Dose: 45 Gy
Reference Point Dosage Given to Date: 41.4 Gy
Reference Point Session Dosage Given: 1.8 Gy
Session Number: 23

## 2024-07-25 ENCOUNTER — Other Ambulatory Visit: Payer: Self-pay

## 2024-07-25 ENCOUNTER — Ambulatory Visit

## 2024-07-25 ENCOUNTER — Ambulatory Visit
Admission: RE | Admit: 2024-07-25 | Discharge: 2024-07-25 | Disposition: A | Discharge status: Home / Self Care | Source: Ambulatory Visit | Attending: Radiation Oncology

## 2024-07-25 DIAGNOSIS — Z510 Encounter for antineoplastic radiation therapy: Secondary | ICD-10-CM | POA: Diagnosis not present

## 2024-07-25 LAB — RAD ONC ARIA SESSION SUMMARY
Course Elapsed Days: 36
Plan Fractions Treated to Date: 24
Plan Prescribed Dose Per Fraction: 1.8 Gy
Plan Total Fractions Prescribed: 25
Plan Total Prescribed Dose: 45 Gy
Reference Point Dosage Given to Date: 43.2 Gy
Reference Point Session Dosage Given: 1.8 Gy
Session Number: 24

## 2024-07-26 ENCOUNTER — Other Ambulatory Visit: Payer: Self-pay

## 2024-07-26 ENCOUNTER — Ambulatory Visit
Admission: RE | Admit: 2024-07-26 | Discharge: 2024-07-26 | Disposition: A | Discharge status: Home / Self Care | Source: Ambulatory Visit | Attending: Radiation Oncology | Admitting: Radiation Oncology

## 2024-07-26 DIAGNOSIS — C61 Malignant neoplasm of prostate: Secondary | ICD-10-CM

## 2024-07-26 DIAGNOSIS — Z510 Encounter for antineoplastic radiation therapy: Secondary | ICD-10-CM | POA: Diagnosis not present

## 2024-07-26 LAB — RAD ONC ARIA SESSION SUMMARY
Course Elapsed Days: 37
Plan Fractions Treated to Date: 25
Plan Prescribed Dose Per Fraction: 1.8 Gy
Plan Total Fractions Prescribed: 25
Plan Total Prescribed Dose: 45 Gy
Reference Point Dosage Given to Date: 45 Gy
Reference Point Session Dosage Given: 1.8 Gy
Session Number: 25

## 2024-07-29 NOTE — Radiation Completion Notes (Addendum)
  Radiation Oncology         (336) [phone] ________________________________  Name: Thomas Sharp MRN: [id number]  Date: 07/26/2024  DOB: August 08, 1946  Referring Physician: Lonni Han, M.D. Date of Service: 2024-07-29 Radiation Oncologist: Adina Barge, M.D. [facility]     RADIATION ONCOLOGY END OF TREATMENT NOTE     Diagnosis: 78 y.o. gentleman with Stage T1c adenocarcinoma of the prostate with Gleason score of 4+5, and PSA of 16.   Intent: Curative     ==========DELIVERED PLANS==========  First Treatment Date: 2024-06-19 Last Treatment Date: 2024-07-26   Plan Name: Prostate_Pelv Site: Prostate Technique: IMRT Mode: Photon Dose Per Fraction: 1.8 Gy Prescribed Dose (Delivered / Prescribed): 45 Gy / 45 Gy Prescribed Fxs (Delivered / Prescribed): 25 / 25   Plan Name: Prostate Seed Implant Site: Prostate Technique: Radioactive Seed Implant I-125 Mode: Brachytherapy Dose Per Fraction: 110 Gy Prescribed Dose (Delivered / Prescribed): 110 Gy / 110 Gy Prescribed Fxs (Delivered / Prescribed): 1 / 1     ==========ON TREATMENT VISIT DATES========== 2024-06-21, 2024-06-28, 2024-07-09, 2024-07-19, 2024-07-26   See weekly On Treatment Notes in Epic for details in the Media tab (listed as Progress notes on the On Treatment Visit Dates listed above).  He tolerated the radiation treatments relatively well with increased LUTS and modest fatigue.  The patient will receive a call in about one month from the radiation oncology department. He will continue follow up with his urologist, Dr. Han, as well.  ------------------------------------------------   Donnice Barge, MD [facility]  Radiation Oncology Direct Dial: [phone]  Fax: [phone] [email or web address]  Skype  LinkedIn

## 2024-07-30 NOTE — Progress Notes (Signed)
 Patient was a RadOnc Consult on 02/28/24 for his stage T1c adenocarcinoma of the prostate with Gleason score of 4+5, and PSA of 16.  Patient proceed with treatment recommendations of brachytherapy boost and use of SpaceOAR gel followed by a 5 week course of daily radiotherapy concurrent with LT-ADT and had his final radiation treatment on 07/26/24.   Patient is scheduled for a post treatment nurse call on 08/27/24 and has his first post treatment PSA on 08/26/24 at [facility].

## 2024-08-27 ENCOUNTER — Ambulatory Visit
Admission: RE | Admit: 2024-08-27 | Discharge: 2024-08-27 | Disposition: A | Discharge status: Home / Self Care | Source: Ambulatory Visit | Attending: Urology | Admitting: Urology

## 2024-08-27 DIAGNOSIS — C61 Malignant neoplasm of prostate: Secondary | ICD-10-CM | POA: Insufficient documentation

## 2024-08-27 NOTE — Progress Notes (Signed)
  Radiation Oncology         [phone]) [phone] ________________________________  Name: Thomas Sharp MRN: [id number]  Date of Service: 08/27/2024  DOB: 1946-09-08  Post Treatment Telephone Note  Diagnosis:  78 y.o. gentleman with Stage T1c adenocarcinoma of the prostate with Gleason score of 4+5, and PSA of 16.   Pre Treatment IPSS Score: 9  The patient was available for call today.   Symptoms of fatigue have improved since completing therapy.  Symptoms of bladder changes have improved since completing therapy. Current symptoms include urinary frequency and nocturia x 4, and medications for bladder symptoms include Tamsulosin 0.4 mg 1 capsule po at bedtime.  Symptoms of bowel changes have improved since completing therapy. No current symptoms and not taking anything for bowels.   Post Treatment IPSS Score:  4  IPSS Questionnaire (AUA-7): Over the past month.   1)  How often have you had a sensation of not emptying your bladder completely after you finish urinating?  0 - Not at all  2)  How often have you had to urinate again less than two hours after you finished urinating? 0 - Not at all  3)  How often have you found you stopped and started again several times when you urinated?  0 - Not at all  4) How difficult have you found it to postpone urination?  0 - Not at all  5) How often have you had a weak urinary stream?  0 - Not at all  6) How often have you had to push or strain to begin urination?  0 - Not at all  7) How many times did you most typically get up to urinate from the time you went to bed until the time you got up in the morning?  4 - 4 times  Total score:  4. Which indicates mild symptoms  0-7 mildly symptomatic   8-19 moderately symptomatic   20-35 severely symptomatic   Patient has a scheduled follow up visit with his urologist, Dr. Lonni Han, on 09/02/2024. He was counseled that PSA levels will be drawn in the urology office, and was reassured that additional  time is expected to improve bowel and bladder symptoms. He was encouraged to call back with concerns or questions regarding radiation.

## 2024-09-02 ENCOUNTER — Other Ambulatory Visit: Payer: Self-pay | Admitting: Urology

## 2024-09-02 DIAGNOSIS — C61 Malignant neoplasm of prostate: Secondary | ICD-10-CM

## 2024-11-26 ENCOUNTER — Encounter: Payer: Self-pay | Admitting: *Deleted

## 2024-12-03 ENCOUNTER — Encounter: Payer: Self-pay | Admitting: *Deleted

## 2024-12-09 ENCOUNTER — Encounter: Payer: Self-pay | Admitting: *Deleted

## 2024-12-23 ENCOUNTER — Encounter: Payer: Self-pay | Admitting: *Deleted

## 2024-12-23 NOTE — Progress Notes (Signed)
 SCP completed and mailed to pt.

## 2024-12-24 ENCOUNTER — Inpatient Hospital Stay: Attending: Adult Health | Admitting: *Deleted

## 2024-12-24 ENCOUNTER — Encounter: Payer: Self-pay | Admitting: *Deleted

## 2024-12-24 DIAGNOSIS — C61 Malignant neoplasm of prostate: Secondary | ICD-10-CM

## 2024-12-24 NOTE — Progress Notes (Signed)
 SCP reviewed and completed. A copy of prostate summary sent to PCP.

## 2025-01-08 ENCOUNTER — Other Ambulatory Visit (HOSPITAL_BASED_OUTPATIENT_CLINIC_OR_DEPARTMENT_OTHER): Payer: Self-pay | Admitting: Urology

## 2025-01-08 DIAGNOSIS — C61 Malignant neoplasm of prostate: Secondary | ICD-10-CM
